# Patient Record
Sex: Male | Born: 1955
Health system: Southern US, Community
[De-identification: ages and names within clinical notes are randomized; demographics above are authoritative.]

## PROBLEM LIST (undated history)

## (undated) DIAGNOSIS — C61 Malignant neoplasm of prostate: Secondary | ICD-10-CM

## (undated) DIAGNOSIS — I10 Essential (primary) hypertension: Secondary | ICD-10-CM

## (undated) DIAGNOSIS — E079 Disorder of thyroid, unspecified: Secondary | ICD-10-CM

## (undated) DIAGNOSIS — J310 Chronic rhinitis: Secondary | ICD-10-CM

## (undated) DIAGNOSIS — E785 Hyperlipidemia, unspecified: Secondary | ICD-10-CM

## (undated) DIAGNOSIS — T7840XA Allergy, unspecified, initial encounter: Secondary | ICD-10-CM

## (undated) DIAGNOSIS — K219 Gastro-esophageal reflux disease without esophagitis: Secondary | ICD-10-CM

## (undated) DIAGNOSIS — J302 Other seasonal allergic rhinitis: Secondary | ICD-10-CM

## (undated) HISTORY — DX: Chronic rhinitis: J31.0

## (undated) HISTORY — DX: Other seasonal allergic rhinitis: J30.2

## (undated) HISTORY — DX: Essential (primary) hypertension: I10

## (undated) HISTORY — DX: Disorder of thyroid, unspecified: E07.9

## (undated) HISTORY — DX: Gastro-esophageal reflux disease without esophagitis: K21.9

## (undated) HISTORY — DX: Hyperlipidemia, unspecified: E78.5

## (undated) HISTORY — DX: Allergy, unspecified, initial encounter: T78.40XA

## (undated) HISTORY — PX: TONSILLECTOMY: SUR1361

## (undated) HISTORY — DX: Malignant neoplasm of prostate: C61

---

## 2005-02-20 ENCOUNTER — Ambulatory Visit: Payer: Self-pay | Admitting: Internal Medicine

## 2005-05-20 ENCOUNTER — Encounter: Payer: Self-pay | Admitting: Internal Medicine

## 2005-10-11 ENCOUNTER — Ambulatory Visit: Payer: Self-pay | Admitting: Internal Medicine

## 2005-10-17 ENCOUNTER — Ambulatory Visit: Payer: Self-pay | Admitting: Internal Medicine

## 2005-12-04 ENCOUNTER — Ambulatory Visit: Payer: Self-pay | Admitting: Internal Medicine

## 2006-01-09 ENCOUNTER — Ambulatory Visit: Payer: Self-pay | Admitting: Internal Medicine

## 2006-03-19 ENCOUNTER — Ambulatory Visit: Payer: Self-pay | Admitting: Internal Medicine

## 2006-03-19 LAB — CONVERTED CEMR LAB
ALT: 34 units/L (ref 0–40)
AST: 23 units/L (ref 0–37)
Chol/HDL Ratio, serum: 4.9
Cholesterol: 179 mg/dL (ref 0–200)
HDL: 36.7 mg/dL — ABNORMAL LOW (ref >39.0)
LDL Cholesterol: 125 mg/dL — ABNORMAL HIGH (ref 0–99)
Triglyceride fasting, serum: 86 mg/dL (ref 0–149)
VLDL: 17 mg/dL (ref 0–40)

## 2006-04-17 ENCOUNTER — Ambulatory Visit: Payer: Self-pay | Admitting: Internal Medicine

## 2006-05-20 HISTORY — PX: OTHER SURGICAL HISTORY: SHX169

## 2006-07-14 ENCOUNTER — Ambulatory Visit: Payer: Self-pay | Admitting: Internal Medicine

## 2006-07-14 LAB — CONVERTED CEMR LAB
BUN: 5 mg/dL — ABNORMAL LOW (ref 6–23)
Basophils Absolute: 0 10*3/uL (ref 0.0–0.1)
Basophils Relative: 0.1 % (ref 0.0–1.0)
CO2: 29 meq/L (ref 19–32)
Calcium: 8.8 mg/dL (ref 8.4–10.5)
Chloride: 102 meq/L (ref 96–112)
Creatinine, Ser: 0.9 mg/dL (ref 0.4–1.5)
Eosinophils Absolute: 0 10*3/uL (ref 0.0–0.6)
Eosinophils Relative: 0.1 % (ref 0.0–5.0)
GFR calc Af Amer: 114 mL/min
GFR calc non Af Amer: 94 mL/min
Glucose, Bld: 94 mg/dL (ref 70–99)
HCT: 44.6 % (ref 39.0–52.0)
Hemoglobin, Urine: NEGATIVE
Hemoglobin: 15 g/dL (ref 13.0–17.0)
Ketones, ur: NEGATIVE mg/dL
Lymphocytes Relative: 9.3 % — ABNORMAL LOW (ref 12.0–46.0)
MCHC: 33.7 g/dL (ref 30.0–36.0)
MCV: 86.7 fL (ref 78.0–100.0)
Monocytes Absolute: 1.1 10*3/uL — ABNORMAL HIGH (ref 0.2–0.7)
Monocytes Relative: 8.7 % (ref 3.0–11.0)
Neutro Abs: 10.1 10*3/uL — ABNORMAL HIGH (ref 1.4–7.7)
Neutrophils Relative %: 81.8 % — ABNORMAL HIGH (ref 43.0–77.0)
Nitrite: NEGATIVE
PSA: 9.3 ng/mL — ABNORMAL HIGH (ref 0.10–4.00)
Platelets: 223 10*3/uL (ref 150–400)
Potassium: 3.6 meq/L (ref 3.5–5.1)
RBC: 5.15 M/uL (ref 4.22–5.81)
RDW: 13.1 % (ref 11.5–14.6)
Sodium: 140 meq/L (ref 135–145)
Urobilinogen, UA: 0.2 (ref 0.0–1.0)
WBC: 12.4 10*3/uL — ABNORMAL HIGH (ref 4.5–10.5)

## 2006-07-29 ENCOUNTER — Ambulatory Visit: Payer: Self-pay | Admitting: Cardiovascular Disease

## 2006-08-18 ENCOUNTER — Ambulatory Visit: Payer: Self-pay

## 2006-08-18 ENCOUNTER — Encounter: Payer: Self-pay | Admitting: Internal Medicine

## 2006-08-18 ENCOUNTER — Ambulatory Visit: Payer: Self-pay | Admitting: Cardiovascular Disease

## 2006-09-23 ENCOUNTER — Ambulatory Visit: Admission: RE | Admit: 2006-09-23 | Discharge: 2006-12-22 | Payer: Self-pay | Admitting: Radiation Oncology

## 2006-12-05 ENCOUNTER — Telehealth (INDEPENDENT_AMBULATORY_CARE_PROVIDER_SITE_OTHER): Payer: Self-pay | Admitting: *Deleted

## 2006-12-11 ENCOUNTER — Encounter: Payer: Self-pay | Admitting: Internal Medicine

## 2006-12-15 ENCOUNTER — Encounter: Admission: RE | Admit: 2006-12-15 | Discharge: 2006-12-15 | Payer: Self-pay | Admitting: Urology

## 2006-12-22 ENCOUNTER — Ambulatory Visit (HOSPITAL_BASED_OUTPATIENT_CLINIC_OR_DEPARTMENT_OTHER): Admission: RE | Admit: 2006-12-22 | Discharge: 2006-12-22 | Payer: Self-pay | Admitting: Urology

## 2006-12-22 ENCOUNTER — Encounter: Payer: Self-pay | Admitting: Internal Medicine

## 2006-12-29 ENCOUNTER — Ambulatory Visit: Payer: Self-pay | Admitting: Internal Medicine

## 2006-12-29 LAB — CONVERTED CEMR LAB: HDL goal, serum: 40 mg/dL

## 2007-01-12 ENCOUNTER — Encounter: Payer: Self-pay | Admitting: Internal Medicine

## 2007-01-12 ENCOUNTER — Ambulatory Visit: Admission: RE | Admit: 2007-01-12 | Discharge: 2007-02-03 | Payer: Self-pay | Admitting: Radiation Oncology

## 2007-04-10 ENCOUNTER — Encounter: Payer: Self-pay | Admitting: Internal Medicine

## 2007-04-21 ENCOUNTER — Encounter: Payer: Self-pay | Admitting: Internal Medicine

## 2007-05-06 ENCOUNTER — Ambulatory Visit: Payer: Self-pay | Admitting: Internal Medicine

## 2007-05-17 LAB — CONVERTED CEMR LAB
Cholesterol: 239 mg/dL (ref 0–200)
Direct LDL: 160.8 mg/dL
Total CHOL/HDL Ratio: 5
Triglycerides: 111 mg/dL (ref 0–149)

## 2007-05-18 ENCOUNTER — Encounter (INDEPENDENT_AMBULATORY_CARE_PROVIDER_SITE_OTHER): Payer: Self-pay | Admitting: *Deleted

## 2007-05-29 ENCOUNTER — Ambulatory Visit: Payer: Self-pay | Admitting: Internal Medicine

## 2007-06-22 ENCOUNTER — Telehealth (INDEPENDENT_AMBULATORY_CARE_PROVIDER_SITE_OTHER): Payer: Self-pay | Admitting: *Deleted

## 2007-06-23 ENCOUNTER — Ambulatory Visit: Payer: Self-pay | Admitting: Internal Medicine

## 2007-06-23 DIAGNOSIS — M4130 Thoracogenic scoliosis, site unspecified: Secondary | ICD-10-CM | POA: Insufficient documentation

## 2007-06-24 ENCOUNTER — Encounter: Payer: Self-pay | Admitting: Internal Medicine

## 2007-06-26 ENCOUNTER — Ambulatory Visit: Payer: Self-pay | Admitting: Internal Medicine

## 2007-06-29 ENCOUNTER — Encounter (INDEPENDENT_AMBULATORY_CARE_PROVIDER_SITE_OTHER): Payer: Self-pay | Admitting: *Deleted

## 2007-06-30 ENCOUNTER — Telehealth (INDEPENDENT_AMBULATORY_CARE_PROVIDER_SITE_OTHER): Payer: Self-pay | Admitting: *Deleted

## 2007-09-04 ENCOUNTER — Ambulatory Visit: Payer: Self-pay | Admitting: Internal Medicine

## 2007-09-04 DIAGNOSIS — E785 Hyperlipidemia, unspecified: Secondary | ICD-10-CM | POA: Insufficient documentation

## 2007-09-04 DIAGNOSIS — C61 Malignant neoplasm of prostate: Secondary | ICD-10-CM | POA: Insufficient documentation

## 2007-09-05 LAB — CONVERTED CEMR LAB
ALT: 27 units/L (ref 0–53)
Albumin: 4.2 g/dL (ref 3.5–5.2)
Alkaline Phosphatase: 55 units/L (ref 39–117)
Cholesterol: 161 mg/dL (ref 0–200)
LDL Cholesterol: 99 mg/dL (ref 0–99)
Total Protein: 7.1 g/dL (ref 6.0–8.3)
Triglycerides: 68 mg/dL (ref 0–149)

## 2007-09-07 ENCOUNTER — Encounter (INDEPENDENT_AMBULATORY_CARE_PROVIDER_SITE_OTHER): Payer: Self-pay | Admitting: *Deleted

## 2008-11-17 ENCOUNTER — Telehealth (INDEPENDENT_AMBULATORY_CARE_PROVIDER_SITE_OTHER): Payer: Self-pay | Admitting: *Deleted

## 2008-12-01 ENCOUNTER — Ambulatory Visit: Payer: Self-pay | Admitting: Internal Medicine

## 2008-12-01 LAB — CONVERTED CEMR LAB
AST: 31 units/L (ref 0–37)
Albumin: 4.1 g/dL (ref 3.5–5.2)
Alkaline Phosphatase: 54 units/L (ref 39–117)
Bilirubin, Direct: 0.2 mg/dL (ref 0.0–0.3)
Total Bilirubin: 1.4 mg/dL — ABNORMAL HIGH (ref 0.3–1.2)
Total CHOL/HDL Ratio: 3
Triglycerides: 57 mg/dL (ref 0.0–149.0)

## 2008-12-05 ENCOUNTER — Ambulatory Visit: Payer: Self-pay | Admitting: Internal Medicine

## 2008-12-13 ENCOUNTER — Telehealth (INDEPENDENT_AMBULATORY_CARE_PROVIDER_SITE_OTHER): Payer: Self-pay | Admitting: *Deleted

## 2008-12-27 ENCOUNTER — Telehealth: Payer: Self-pay | Admitting: Family Medicine

## 2008-12-28 ENCOUNTER — Ambulatory Visit: Payer: Self-pay | Admitting: Internal Medicine

## 2009-01-06 ENCOUNTER — Ambulatory Visit: Payer: Self-pay | Admitting: Internal Medicine

## 2009-03-30 ENCOUNTER — Ambulatory Visit: Payer: Self-pay | Admitting: Internal Medicine

## 2009-05-12 ENCOUNTER — Ambulatory Visit: Payer: Self-pay | Admitting: Family Medicine

## 2009-09-11 ENCOUNTER — Telehealth (INDEPENDENT_AMBULATORY_CARE_PROVIDER_SITE_OTHER): Payer: Self-pay | Admitting: *Deleted

## 2010-04-11 ENCOUNTER — Telehealth (INDEPENDENT_AMBULATORY_CARE_PROVIDER_SITE_OTHER): Payer: Self-pay | Admitting: *Deleted

## 2010-05-20 HISTORY — PX: COLONOSCOPY: SHX174

## 2010-05-23 ENCOUNTER — Ambulatory Visit
Admission: RE | Admit: 2010-05-23 | Discharge: 2010-05-23 | Payer: Self-pay | Source: Home / Self Care | Attending: Internal Medicine | Admitting: Internal Medicine

## 2010-05-23 DIAGNOSIS — M5412 Radiculopathy, cervical region: Secondary | ICD-10-CM | POA: Insufficient documentation

## 2010-05-23 DIAGNOSIS — K219 Gastro-esophageal reflux disease without esophagitis: Secondary | ICD-10-CM | POA: Insufficient documentation

## 2010-05-30 ENCOUNTER — Ambulatory Visit
Admission: RE | Admit: 2010-05-30 | Discharge: 2010-05-30 | Payer: Self-pay | Source: Home / Self Care | Attending: Internal Medicine | Admitting: Internal Medicine

## 2010-05-30 ENCOUNTER — Other Ambulatory Visit: Payer: Self-pay | Admitting: Internal Medicine

## 2010-05-30 LAB — HEPATIC FUNCTION PANEL
ALT: 30 U/L (ref 0–53)
AST: 34 U/L (ref 0–37)
Albumin: 4.1 g/dL (ref 3.5–5.2)
Alkaline Phosphatase: 55 U/L (ref 39–117)
Bilirubin, Direct: 0.1 mg/dL (ref 0.0–0.3)
Total Bilirubin: 1.1 mg/dL (ref 0.3–1.2)
Total Protein: 6.9 g/dL (ref 6.0–8.3)

## 2010-05-30 LAB — CBC WITH DIFFERENTIAL/PLATELET
Basophils Absolute: 0 10*3/uL (ref 0.0–0.1)
Basophils Relative: 0.1 % (ref 0.0–3.0)
Eosinophils Absolute: 0.2 10*3/uL (ref 0.0–0.7)
Eosinophils Relative: 3.7 % (ref 0.0–5.0)
HCT: 42.1 % (ref 39.0–52.0)
Hemoglobin: 14.4 g/dL (ref 13.0–17.0)
Lymphocytes Relative: 23.5 % (ref 12.0–46.0)
Lymphs Abs: 1.2 10*3/uL (ref 0.7–4.0)
MCHC: 34.2 g/dL (ref 30.0–36.0)
MCV: 88.5 fl (ref 78.0–100.0)
Monocytes Absolute: 0.6 10*3/uL (ref 0.1–1.0)
Monocytes Relative: 10.7 % (ref 3.0–12.0)
Neutro Abs: 3.3 10*3/uL (ref 1.4–7.7)
Neutrophils Relative %: 62 % (ref 43.0–77.0)
Platelets: 183 10*3/uL (ref 150.0–400.0)
RBC: 4.76 Mil/uL (ref 4.22–5.81)
RDW: 14 % (ref 11.5–14.6)
WBC: 5.3 10*3/uL (ref 4.5–10.5)

## 2010-05-30 LAB — LIPID PANEL
Cholesterol: 143 mg/dL (ref 0–200)
HDL: 48.2 mg/dL (ref >39.00)
LDL Cholesterol: 82 mg/dL (ref 0–99)
Total CHOL/HDL Ratio: 3
Triglycerides: 62 mg/dL (ref 0.0–149.0)
VLDL: 12.4 mg/dL (ref 0.0–40.0)

## 2010-06-21 NOTE — Progress Notes (Signed)
Summary: Refill Requests  Phone Note Refill Request Call back at [phone] Message from:  Pharmacy on April 11, 2010 10:33 AM  Refills Requested: Medication #1:  CRESTOR 20 MG  TABS 1 qd   Dosage confirmed as above?Dosage Confirmed   Supply Requested: 3 months   Last Refilled: 12/05/2008  Medication #2:  OMEPRAZOLE 20 MG  CPDR 1 by mouth QD   Dosage confirmed as above?Dosage Confirmed   Supply Requested: 3 months   Last Refilled: 09/11/2009 [facility]  Next Appointment Scheduled: none Initial call taken by: Harold Barban,  April 11, 2010 10:34 AM  Follow-up for Phone Call        Patient needs to schedule  a CPX  Follow-up by: Shonna Chock CMA,  April 11, 2010 10:41 AM    New/Updated Medications: OMEPRAZOLE 20 MG  CPDR (OMEPRAZOLE) 1 by mouth once daily**APPOINTMENT DUE** CRESTOR 20 MG  TABS (ROSUVASTATIN CALCIUM) 1 by mouth once daily **APPOINTMENT DUE** Prescriptions: OMEPRAZOLE 20 MG  CPDR (OMEPRAZOLE) 1 by mouth once daily**APPOINTMENT DUE**  #30 x 0   Entered by:   Shonna Chock CMA   Authorized by:   Marga Melnick MD   Signed by:   Shonna Chock CMA on 04/11/2010   Method used:   Electronically to        [facility]* (retail)       [address]       [address] [place] [address]       [place], [place]  [address]       Ph: [phone]       Fax: [phone]   RxID:   [id number] CRESTOR 20 MG  TABS (ROSUVASTATIN CALCIUM) 1 by mouth once daily **APPOINTMENT DUE**  #30 x 0   Entered by:   Shonna Chock CMA   Authorized by:   Marga Melnick MD   Signed by:   Shonna Chock CMA on 04/11/2010   Method used:   Electronically to        [facility]* (retail)       [address]       [address] [place] [address]       [place], [place]  [address]       Ph: [phone]       Fax: [phone]   RxID:   [id number]

## 2010-06-21 NOTE — Progress Notes (Signed)
Summary: Refill Request  Phone Note Refill Request Call back at [phone] Message from:  Pharmacy on September 11, 2009 1:03 PM  Refills Requested: Medication #1:  OMEPRAZOLE 20 MG  CPDR 1 by mouth QD   Dosage confirmed as above?Dosage Confirmed   Supply Requested: 3 months   Last Refilled: 06/14/2009 Next Appointment Scheduled: none Initial call taken by: Harold Barban,  September 11, 2009 1:04 PM    Prescriptions: OMEPRAZOLE 20 MG  CPDR (OMEPRAZOLE) 1 by mouth QD  #90 Capsule x 1   Entered by:   Shonna Chock   Authorized by:   Marga Melnick MD   Signed by:   Shonna Chock on 09/11/2009   Method used:   Electronically to        [facility]* (retail)       [address]       [address] [place] [address]       [place], [place]  [address]       Ph: [phone]       Fax: [phone]   RxID:   [id number]

## 2010-06-21 NOTE — Assessment & Plan Note (Signed)
Summary: MED REFILL/KB   Vital Signs:  Patient profile:   55 year old male Height:      66.5 inches Weight:      195.6 pounds BMI:     31.21 Temp:     97.6 degrees F oral Pulse rate:   76 / minute Resp:     16 per minute BP sitting:   134 / 80  (left arm) Cuff size:   large  Vitals Entered By: Shonna Chock CMA (May 23, 2010 2:12 PM) CC: Renew meds (90 day supply-[facility] [facility] out-patient) , Heartburn, Lipid Management, Back pain   CC:  Renew meds (90 day supply-[facility]) , Heartburn, Lipid Management, and Back pain.  History of Present Illness:      This is a 55 year old man who presents for F/U of GERD/ Heartburn.  The patient denies acid reflux, sour taste in mouth, epigastric pain, chest pain, trouble swallowing, weight loss, and weight gain if he continues the medication.  The patient denies the following alarm features: melena, dysphagia, hematemesis, and vomiting.  Symptoms are worse with spicy foods. No prior evaluation  to date.  The patient has found the PPI, Omeprazole to completely controls the symptoms .      Hyperlipidemia Follow-Up:  The patient denies muscle aches, flushing, itching, constipation, diarrhea, and fatigue.  The patient denies the following symptoms: chest pain/pressure, exercise intolerance, dypsnea, palpitations, syncope, and pedal edema.  Compliance with medications (by patient report) has been near 100%.  Dietary compliance has been good.  The patient reports exercising 5-7 X per week.  Adjunctive measures currently used by the patient include fiber, ASA, and fish oil supplements.  last LDL was 65 in 2010.      The patient also  has intermittent back pain.  The pain is located in the left upper  thoracic back @ the scapula.  The pain began after straining on  working out on weight machines. Swimming breast stroke will exacerbate it  The pain radiates to the left  4th & 5th fingers.   Lipid Management History:      Positive NCEP/ATP III risk  factors include male age 57 years old or older.  Negative NCEP/ATP III risk factors include non-diabetic, no family history for ischemic heart disease, non-tobacco-user status, non-hypertensive, no ASHD (atherosclerotic heart disease), no prior stroke/TIA, no peripheral vascular disease, and no history of aortic aneurysm.     Current Medications (verified): 1)  Omeprazole 20 Mg  Cpdr (Omeprazole) .Marland Kitchen.. 1 By Mouth Once Daily**appointment Due** 2)  Mucinex D .... Prn 3)  Crestor 20 Mg  Tabs (Rosuvastatin Calcium) .Marland Kitchen.. 1 By Mouth Once Daily **appointment Due** 4)  Viagra 100 Mg  Tabs (Sildenafil Citrate) .... Prn 5)  Adult Aspirin Ec Low Strength 81 Mg  Tbec (Aspirin) 6)  Voltaren 1 % Gel (Diclofenac Sodium) .... Apply Two Times A Day As Needed 7)  Tramadol Hcl 50 Mg Tabs (Tramadol Hcl) .Marland Kitchen.. 1 Q 6 Hrs As Needed  Allergies (verified): No Known Drug Allergies  Past History:  Past Medical History: Hyperlipidemia: [place]ham Study LDL goal = < 160. NMR Lipoprofile 2007: LDL 188 ( 2618/2018), HDL 38, TG 127. LDL goal= < 100, ideally < 75. rhinitis , seasonal prostate cancer, Dr Brunilda Payor  Past Surgical History: Prostatectomy,  S/P radiation, Dr  Su Grand Tonsillectomy Colonoscopy : NONE to date [facility] reviewed)  Family History: Father: suicide Mother:  lung cancer Siblings: bro: dyslipidemia, 5 vessel CBAG 2010 @ 56;prostate  cancer; M uncle : MI @ 29; MGF : MI @ 29  Social History: Former Smoker: quit 1986 Alcohol use-yes: socialy Regular exercise-yes  Review of Systems GU:  Denies discharge, dysuria, and hematuria; Last PSA 0.15.  Physical Exam  General:  well-nourished;alert,appropriate and cooperative throughout examination Neck:  No deformities, masses, or tenderness noted. Lungs:  Normal respiratory effort, chest expands symmetrically. Lungs are clear to auscultation, no crackles or wheezes. Heart:  Normal rate and regular rhythm. S1 and S2 normal without gallop, murmur, click, rub  .S4 Abdomen:  Bowel sounds positive,abdomen soft and non-tender without masses, organomegaly or hernias noted. Prostate:  Dr  Brunilda Payor  Pulses:  R and L carotid,radial,dorsalis pedis and posterior tibial pulses are full and equal bilaterally Extremities:  No clubbing, cyanosis, edema, or deformity noted with normal full range of motion of  arms  Neurologic:  alert & oriented X3, strength normal in all extremities, and DTRs symmetrical and normal.   Skin:  Intact without suspicious lesions or rashes Cervical Nodes:  No lymphadenopathy noted Axillary Nodes:  No palpable lymphadenopathy Psych:  memory intact for recent and remote, normally interactive, and good eye contact.     Impression & Recommendations:  Problem # 1:  GERD (ICD-530.81)  His updated medication list for this problem includes:    Omeprazole 20 Mg Cpdr (Omeprazole) .Marland Kitchen... 1 by mouth once daily  Problem # 2:  HYPERLIPIDEMIA (ICD-272.4)  His updated medication list for this problem includes:    Crestor 20 Mg Tabs (Rosuvastatin calcium) .Marland Kitchen... 1 by mouth once daily   Problem # 3:  CERVICAL RADICULOPATHY, LEFT (ICD-723.4)  C-8   Orders: T-Cervical Spine Comp w/Flex & Ext (16109UE)  Complete Medication List: 1)  Omeprazole 20 Mg Cpdr (Omeprazole) .Marland Kitchen.. 1 by mouth once daily 2)  Mucinex D  .... Prn 3)  Crestor 20 Mg Tabs (Rosuvastatin calcium) .Marland Kitchen.. 1 by mouth once daily 4)  Viagra 100 Mg Tabs (Sildenafil citrate) .... Prn 5)  Adult Aspirin Ec Low Strength 81 Mg Tbec (Aspirin) 6)  Voltaren 1 % Gel (Diclofenac sodium) .... Apply two times a day as needed 7)  Tramadol Hcl 50 Mg Tabs (Tramadol hcl) .Marland Kitchen.. 1 q 6 hrs as needed  Lipid Assessment/Plan:      Based on NCEP/ATP III, the patient's risk factor category is "0-1 risk factors".  The patient's lipid goals are as follows: Total cholesterol goal is 200; LDL cholesterol goal is 100; HDL cholesterol goal is 40; Triglyceride goal is 150.  His LDL cholesterol goal has been met.   Secondary causes for hyperlipidemia have been ruled out.  He has been counseled on adjunctive measures for lowering his cholesterol and has been provided with dietary instructions.    Patient Instructions: 1)  Please schedule fasting labs: 2)  Hepatic Panel (995.20); 3)  Lipid Panel (272.4); 4)  CBC w/ Diff (530.81). 5)  Schedule a colonoscopy  to help detect colon cancer as  per the [facility] we discussed. Complete stool cards. 6)  Avoid foods high in acid (tomatoes, citrus juices, spicy foods). Avoid eating within two hours of lying down or before exercising. Do not over eat; try smaller more frequent meals. Elevate head of bed twelve inches when sleeping. Prescriptions: OMEPRAZOLE 20 MG  CPDR (OMEPRAZOLE) 1 by mouth once daily  #90 x 0   Entered and Authorized by:   Marga Melnick MD   Signed by:   Marga Melnick MD on 05/23/2010   Method used:   Electronically to  [facility]* (retail)       [address]       [address]       [place], [place]  [address]       Ph: [phone]       Fax: [phone]   RxID:   [id number] CRESTOR 20 MG  TABS (ROSUVASTATIN CALCIUM) 1 by mouth once daily  #90 x 0   Entered and Authorized by:   Marga Melnick MD   Signed by:   Marga Melnick MD on 05/23/2010   Method used:   Electronically to        [facility]* (retail)       [address]       [address]mail[address]       [place], [place]  [address]       Ph: [phone]       Fax: [phone]   RxID:   [id number]    Orders Added: 1)  Est. Patient Level IV [88416] 2)  T-Cervical Spine Comp w/Flex & Ext [72052TC]

## 2010-08-22 ENCOUNTER — Other Ambulatory Visit: Payer: Self-pay | Admitting: Internal Medicine

## 2010-09-13 ENCOUNTER — Encounter: Payer: Self-pay | Admitting: Internal Medicine

## 2010-09-14 ENCOUNTER — Encounter: Payer: Self-pay | Admitting: Internal Medicine

## 2010-09-14 ENCOUNTER — Ambulatory Visit (INDEPENDENT_AMBULATORY_CARE_PROVIDER_SITE_OTHER): Payer: 59 | Admitting: Internal Medicine

## 2010-09-14 VITALS — BP 128/84 | HR 99 | Temp 99.4°F | Wt 184.0 lb

## 2010-09-14 DIAGNOSIS — J029 Acute pharyngitis, unspecified: Secondary | ICD-10-CM

## 2010-09-14 DIAGNOSIS — J069 Acute upper respiratory infection, unspecified: Secondary | ICD-10-CM

## 2010-09-14 LAB — POCT RAPID STREP A (OFFICE): Rapid Strep A Screen: NEGATIVE

## 2010-09-14 NOTE — Assessment & Plan Note (Addendum)
Most likely has a URI, strep a neg. See instructions. Also rec to go back to loratadine. Addendum: request abx, I see no indication for that at this point, will call in few days if no better

## 2010-09-14 NOTE — Patient Instructions (Signed)
Rest, fluids , tylenol For cough, take Mucinex DM twice a day as needed  call if no better in few days Call anytime if the symptoms are severe, you have high fever, short of breath

## 2010-09-14 NOTE — Progress Notes (Signed)
  Subjective:    Patient ID: GWEN EDLER, male    DOB: 1956/03/07, 55 y.o.   MRN: [id number]  HPI  Mild ST , "aches" x 3 -4 days, has forgotten to take loratadine in the last few days . ST today is actually slt better today  Past Medical History  Diagnosis Date  . Hyperlipemia     [place] study LDL goal= <160. NMR Lipoprofile 2007: LDL 188 (2618/2018) HDL 38, TG 127. LDL goal= <100, ideally <75  . Rhinitis   . Prostate cancer     Dr Brunilda Payor   No past surgical history on file.   Review of Systems  Constitutional:       + subjective fever  HENT:       + nasal d/c, mild PN drip, > than baseline  Respiratory:       Mild cough, dry       Objective:   Physical Exam  Constitutional: He appears well-developed and well-nourished. No distress.  HENT:  Head: Normocephalic and atraumatic.       Nose slt congested w/o d/c . Throat no red-no d/c Tonsils not seen  Cardiovascular: Normal rate, regular rhythm and normal heart sounds.   No murmur heard. Pulmonary/Chest: Effort normal and breath sounds normal. No respiratory distress. He has no wheezes. He has no rales.          Assessment & Plan:

## 2010-09-19 ENCOUNTER — Telehealth: Payer: Self-pay | Admitting: Internal Medicine

## 2010-09-19 NOTE — Telephone Encounter (Signed)
Error wrong patient

## 2010-10-01 ENCOUNTER — Encounter: Payer: Self-pay | Admitting: Internal Medicine

## 2010-10-02 ENCOUNTER — Ambulatory Visit (INDEPENDENT_AMBULATORY_CARE_PROVIDER_SITE_OTHER): Payer: 59 | Admitting: Internal Medicine

## 2010-10-02 ENCOUNTER — Encounter: Payer: Self-pay | Admitting: Internal Medicine

## 2010-10-02 DIAGNOSIS — J069 Acute upper respiratory infection, unspecified: Secondary | ICD-10-CM

## 2010-10-02 DIAGNOSIS — K625 Hemorrhage of anus and rectum: Secondary | ICD-10-CM

## 2010-10-02 DIAGNOSIS — K649 Unspecified hemorrhoids: Secondary | ICD-10-CM

## 2010-10-02 MED ORDER — HYDROCORTISONE ACE-PRAMOXINE 1-1 % RE FOAM: 1.0000 | Freq: Two times a day (BID) | RECTAL | Status: AC

## 2010-10-02 NOTE — Progress Notes (Signed)
Subjective:    Patient ID: Jose Stevens, male    DOB: 16-Jan-1956, 55 y.o.   MRN: [id number]  HPI Respiratory tract infection Onset/symptoms: fever 3 weeks ago with arthralgias, ST & nasal congestion Exposures (illness/environmental/extrinsic):symptoms 24 hrs after overexertion Progression of symptoms:improved as of  Treatments/response:Ceftin 2 weeks ago from  [facility] with only partial benefit; he took it irregularly Present symptoms: malaise last night & this am, perspiring this am; all symptoms resolved after BM   Fever/chills/sweats:no Frontal headache:no Facial pain:no Nasal purulence:no Dental pain:no Lymphadenopathy:no Wheezing/shortness of breath:no Cough/sputum/hemoptysis:no Associated extrinsic/allergic symptoms:itchy eyes/ sneezing:sneezing 1X/ day Past medical history: Seasonal allergies/asthma:no Smoking history: quit 1986   Rectal bleeding:                                                                                                                                  Onset: 1 week ago & yesterday attributed to hemorrhoids  Severity: painless;only on tissue Better with: Bee pollen keeps him regular Worse with: with straining Symptoms Nausea/Vomiting: no  Diarrhea: no  Constipation: no  Melena: no  Hematemesis: no  Anorexia: no  Fever/Chills: no Wt loss:naturally with Bee pollen & exercise NSAIDs/ASA: nis No bleeding dyscrasias or clotting issues (hematuria, epistaxis)   Past Surgeries: no Colonoscopy ; never scheduled (SOC reviewed). No FH of GI disease           Review of Systems     Objective:   Physical Exam  General appearance is one of good health and nourishment w/o distress.  Eyes: No conjunctival inflammation or scleral icterus is present. Arcus senilis.Ears:  External ear exam shows no significant lesions or deformities.  Otoscopic examination reveals clear canals, tympanic membranes are intact bilaterally without bulging, retraction,  inflammation or discharge. Hearing is grossly normal bilaterally. Oral exam: Dental hygiene is good; lips and gums are healthy appearing.There is no oropharyngeal erythema or exudate noted. l    Heart:  Normal rate and regular rhythm. S1 and S2 normal without gallop, murmur, click, rub or other extra sounds     Lungs:Chest clear to auscultation; no wheezes, rhonchi,rales ,or rubs present.No increased work of breathing.   Abdomen: bowel sounds normal, soft and non-tender without masses, organomegaly or hernias noted.  No guarding or rebound   Skin:Warm & dry.  Intact without suspicious lesions or rashes ; no jaundice or tenting  Lymphatic: No lymphadenopathy is noted about the head, neck, axilla, or inguinal areas.    Rectal tone is normal. Internal and external hemorrhoids are present; fecal occult blood is trace positive. The prostate is enlarged and slightly irregular in contour.             Assessment & Plan:  #1 respiratory tract infection; clinically resolved. No indication for additional antibiotics are present  #2 rectal bleeding most likely from hemorrhoids.  #3 prostate cancer, as per urology  Plan: #1 sitz baths followed by ProctoFoam-HC to 3 times a day  as needed  #2 GI referral for consideration of colonoscopy as per [facility]

## 2010-10-02 NOTE — Op Note (Signed)
Jose Stevens, Jose Stevens              ACCOUNT NO.:  [id number]   MEDICAL RECORD NO.:  [id number]         PATIENT TYPE:  HAMB   LOCATION:                               FACILITY:  [facility]   PHYSICIAN:  Lindaann Slough, M.D.  DATE OF BIRTH:  1955/11/12   DATE OF PROCEDURE:  12/22/2006  DATE OF DISCHARGE:                               OPERATIVE REPORT   PREOPERATIVE DIAGNOSIS:  Adenocarcinoma of prostate.   POSTOPERATIVE DIAGNOSIS:  Adenocarcinoma of prostate.   PROCEDURES:  1. I-125 seeds implantation.  2. Cystoscopy.   SURGEON:  Dr. Brunilda Payor and Dr. Dayton Scrape.   ANESTHESIA:  General.   INDICATIONS:  The patient is a 55 year old male with prostate cancer  diagnosed by biopsy on August 28, 2006.  PSA at diagnosis was 7.66.  Gleason score is 3+4.  The  treatment options were discussed with the  patient and he chose to have radiation treatment.  He was seen in  consultation by Dr. Dayton Scrape.  He is scheduled for combination therapy and  he has already received IMRT and he is scheduled today for  brachytherapy.   PROCEDURE IN DETAIL:  Under general anesthesia, the patient was prepped  and draped and placed in the dorsal lithotomy position.  The transducer  was inserted in the rectum and the grid was attached to the transducer.  Two stabilizing needles were placed in the prostate and ultrasound  planning was done by Dr. Dayton Scrape.  When the planning was completed, under  ultrasound guidance, a total of 22 needles where used to insert 51 seeds  in the prostate.  He also had seeds in the seminal vesicles.  At the end  of the procedure, an x-ray of the pelvis was done and there appears to  be good seeds distribution.  Then, the Foley catheter that was  previously inserted in the bladder was removed.   A flexible cystoscope was then inserted in the bladder.  The anterior  urethra was normal.  There was moderate prostatic hypertrophy.  There  was no evidence of seeds, stone or tumor in the bladder.  The  ureteral  orifices are in  normal position and shape with clear efflux.  The cystoscope was then  removed.  A #16 Foley catheter was then reinserted in the bladder.   The patient tolerated the procedure well and left the OR in satisfactory  condition to postanesthesia care unit.      Lindaann Slough, M.D.  Electronically Signed     MN/MEDQ  D:  12/22/2006  T:  12/22/2006  Job:  [id number]   cc:   Maryln Gottron, M.D.  Fax: [phone]

## 2010-10-02 NOTE — Patient Instructions (Signed)
Use the ProctoFoam HC 2-3 times a day after the sitz bath's.

## 2010-10-05 ENCOUNTER — Encounter: Payer: Self-pay | Admitting: *Deleted

## 2010-10-05 NOTE — Assessment & Plan Note (Signed)
[facility]                            CARDIOLOGY OFFICE NOTE   DASH, CARDARELLI                     MRN:          [id number]  DATE:07/29/2006                            DOB:          [birth year]    Mr. Jose Stevens is seen today as a new patient.  He is a very pleasant 55-  year-old patient of Dr. Frederik Pear.  His wife works at the sleep center of  [employer].   He has been having substernal chest pressure radiating to the left arm.   The symptoms started 4 to 5 weeks ago.  He indicates that the pain  started when he tried a new weight-lifting machine at the fitness club  called [employer].  He had upper back pain behind his left shoulder blade.  It clearly was related to this incident.  Since that time, it has waxed  and waned.  He thought that it had improved and then he re-injured it  doing the butterfly stroke in the pool.  He has been taking  antiinflammatories for the last 2 days with some relief.  In general,  prior to this, he has been fairly healthy.  His coronary risk factors  include hypercholesterolemia, which he has been on drugs for, for about  6 months.   The patient is a non-diabetic nonsmoker.  There is no family history for  premature coronary disease, and his blood pressure has been excellent.   The pain in his chest can be exertional, but it is more positional in  nature, particularly increasing with twisting motions.   REVIEW OF SYSTEMS:  Remarkable for no PND or orthopnea.  No history of  rheumatic heart disease.  No syncope.  No palpitations.   He quit smoking in 1986.  He drinks 2 to 3 caffeinated beverages a day.  Up until his injury, he had been fairly fit and worked out on a regular  basis.  He is self-employed.  He is happily married.  He has 2 teenage  boys who are fairly good athletes.  Only previous surgeries include a  tonsillectomy.  His mother died of cancer at age 8.  His father died at  age 83 of  self-inflicted problems.   CURRENT MEDICATIONS:  1. Prilosec 20 a day.  2. Cipro for prostate problems.  3. Aspirin a day.  4. Vytorin.   I had a fairly long discussion with the patient about his prostate.  When I found out he was taking Cipro, I asked him what for.  He  apparently has had escalating PSA going from 3 to 6 to 9.  I urged him  to follow up with a urologist and have an ultrasound-guided biopsy to  further look into this.  I explained to him the natural history of  prostate cancer and the need for early treatment and detection.   EXAM:  Remarkable for a blood pressure of 125/70, pulse 70 and regular.  HEENT:  Normal.  There are no carotid bruits.  No lymphadenopathy.  There is an S1, S2 with a very short systolic murmur.  ABDOMEN:  Benign.  LOWER EXTREMITIES:  Intact pulses.  No edema.  There are no renal bruits.   ELECTROCARDIOGRAM:  At rest, normal.   IMPRESSION:  Atypical chest pain, likely related to muscle strain,  occurring while working out and re-injured while swimming.   The pain seems to be improving with antiinflammatories.   I think the patient should have an exercise treadmill test.  He has a  normal baseline EKG and I think a treadmill would rule out coronary  disease efficiently.   I think it is also reasonable to do a 2D echocardiogram in the setting  of chest pain to rule out hypertrophic cardiomyopathy or other  structural abnormalities of his heart.  We will try to arrange both of  these tests on the same day.  He will continue with his antiinflammatory  medicines.  He will follow up with Dr. Alwyn Ren in regards to his  cholesterol.   Overall, the patient seems to be more fit than most 55 year old  patients, and I suspect this pain is truly musculoskeletal.     Theron Arista C. Eden Emms, MD, [facility]  Electronically Signed    PCN/MedQ  DD: 07/29/2006  DT: 07/31/2006  Job #: [id number]

## 2010-10-05 NOTE — Procedures (Signed)
[name]                              EXERCISE TREADMILL   Jose Stevens, Jose Stevens                     MRN:          [id number]  DATE:08/18/2006                            DOB:          03-28-1956    A 55 year old patient with chest pain.  The patient exercises for 10  minutes on a standard Bruce protocol.  Exercise was stopped due to  fatigue.  Maximum heart rate was 162.  Peak blood pressure is 175/82.  Resting EKG showed some minor ST-T wave changes in the inferior leads  with stress.  There was no additional changes and no evidence of  significant ischemia or arrhythmia.   IMPRESSION:  Negative exercise stress test of a good workload.     Noralyn Pick. Eden Emms, MD, [facility]  Electronically Signed    PCN/MedQ  DD: 08/18/2006  DT: 08/18/2006  Job #: [id number]

## 2010-10-08 ENCOUNTER — Ambulatory Visit (INDEPENDENT_AMBULATORY_CARE_PROVIDER_SITE_OTHER): Payer: 59 | Admitting: Internal Medicine

## 2010-10-08 ENCOUNTER — Encounter: Payer: Self-pay | Admitting: Internal Medicine

## 2010-10-08 VITALS — BP 138/88 | HR 120 | Temp 98.7°F | Wt 182.0 lb

## 2010-10-08 DIAGNOSIS — M542 Cervicalgia: Secondary | ICD-10-CM

## 2010-10-08 DIAGNOSIS — Z8719 Personal history of other diseases of the digestive system: Secondary | ICD-10-CM

## 2010-10-08 DIAGNOSIS — J029 Acute pharyngitis, unspecified: Secondary | ICD-10-CM

## 2010-10-08 NOTE — Progress Notes (Signed)
  Subjective:    Patient ID: Jose Stevens, male    DOB: 23-Jan-1956, 55 y.o.   MRN: [id number]  HPI SORE THROAT   Onset: last week in April(prior record reviewed)  Severity: up to 7 Better with: initially better with Cefuroxime Rxed 04/28 Symptoms  Fever:? yes, but not documented    Cough/URI sxs: no Myalgias: no, but arthralgias Headache: no Swollen neck glands: yes, under jaw    Recent Strep Exposure:son had strep 2 mos ago Allergy Sxs: no Red Flags Breathing difficulty: no Trismus: no      Review of Systems ? Dental abscess as per Dentist 2  Mos ago based on Xrays     Objective:   Physical Exam General appearance is of good health and nourishment; no acute distress or increased work of breathing is present.  No  lymphadenopathy about the head, neck, or axilla noted.   Eyes: No conjunctival inflammation or lid edema is present.Vision intact  Ears:  External ear exam shows no significant lesions or deformities.  Otoscopic examination reveals clear canals, tympanic membranes are intact bilaterally without bulging, retraction, inflammation or discharge.  Nose:  External nasal examination shows no deformity or inflammation. Nasal mucosa are pink and moist without lesions or exudates. No septal dislocation or dislocation.No obstruction to airflow.   Oral exam: Dental hygiene is good; lips and gums are healthy appearing.There is no oropharyngeal erythema or exudate noted.  Lower partial  Neck:  No deformities or  Masses. Thyroid w/o nodules but  tenderness noted.   Supple with full range of motion without pain.   Heart:  Normal rate and regular rhythm. S1 and S2 normal without gallop, murmur, click, rub or other extra sounds. S4  Lungs:Chest clear to auscultation; no wheezes, rhonchi,rales ,or rubs present.No increased work of breathing.    Extremities:  No cyanosis, edema, or clubbing  noted    Skin: Warm & dry w/o jaundice or tenting.          Assessment & Plan:  #1  sore throat; clinically the thyroid is tender to palpation without any palpable abnormalities.S/P Cefuroxime x 10 days  #2 possible dental abscess  Plan: #1 CBC and differential, free T4, free T3, TSH, ultrasound of the thyroid.

## 2010-10-08 NOTE — Patient Instructions (Addendum)
Use the Magic mouthwash as prescribed, 1 teaspoon gargle and swallow 3 times a day. Please get a thermometer and keep a temperature diary.  Please share this record with your Dentist. If there is a dental abscess it does need to be incised & drained

## 2010-10-09 ENCOUNTER — Encounter: Payer: Self-pay | Admitting: *Deleted

## 2010-10-09 LAB — CBC WITH DIFFERENTIAL/PLATELET
Basophils Relative: 0.1 % (ref 0.0–3.0)
Eosinophils Absolute: 0.2 10*3/uL (ref 0.0–0.7)
Eosinophils Relative: 2.9 % (ref 0.0–5.0)
Lymphocytes Relative: 21.6 % (ref 12.0–46.0)
Monocytes Relative: 17 % — ABNORMAL HIGH (ref 3.0–12.0)
Neutrophils Relative %: 58.4 % (ref 43.0–77.0)
RBC: 4.61 Mil/uL (ref 4.22–5.81)
WBC: 5.3 10*3/uL (ref 4.5–10.5)

## 2010-10-09 LAB — T3, FREE: T3, Free: 3.9 pg/mL (ref 2.3–4.2)

## 2010-10-09 LAB — T4, FREE: Free T4: 1.68 ng/dL — ABNORMAL HIGH (ref 0.60–1.60)

## 2010-10-10 ENCOUNTER — Ambulatory Visit (INDEPENDENT_AMBULATORY_CARE_PROVIDER_SITE_OTHER): Payer: 59 | Admitting: Internal Medicine

## 2010-10-10 ENCOUNTER — Encounter: Payer: Self-pay | Admitting: Internal Medicine

## 2010-10-10 VITALS — BP 110/76 | HR 80 | Ht 67.0 in | Wt 181.0 lb

## 2010-10-10 DIAGNOSIS — Z1211 Encounter for screening for malignant neoplasm of colon: Secondary | ICD-10-CM

## 2010-10-10 DIAGNOSIS — K625 Hemorrhage of anus and rectum: Secondary | ICD-10-CM

## 2010-10-10 MED ORDER — PEG-KCL-NACL-NASULF-NA ASC-C 100 G PO SOLR: 1.0000 | Freq: Once | ORAL | Status: AC

## 2010-10-10 NOTE — Progress Notes (Signed)
HISTORY OF PRESENT ILLNESS:  Jose Stevens is a 55 y.o. male with hyperlipidemia who is sent today regarding minor rectal bleeding and screening colonoscopy. Patient reports intermittent rectal bleeding as manifested by blood on the toilet tissue and occasionally in the stool. There is often a stinging sensation in the rectum with defecation, when blood is present. He denies abdominal pain or change in bowel habits. GI review of systems is otherwise remarkable for GERD. He takes omeprazole with good results. Review of outside laboratories from January 2012 find a normal CBC with a hemoglobin of 14.4. His brother has a history of colon polyps (type unknown), but no family history of colon cancer  REVIEW OF SYSTEMS:  All non-GI ROS negative except for sore throat.  Past Medical History  Diagnosis Date  . Hyperlipemia     [place] study LDL goal= <160. NMR Lipoprofile 2007: LDL 188 (2618/2018) HDL 38, TG 127. LDL goal= <100, ideally <75  . Rhinitis   . Prostate cancer     Dr Jose Stevens  . Hemorrhoids     Past Surgical History  Procedure Date  . Prostatectomy     s/p radiation, Dr.Nesi  . Tonsillectomy     Social History Jose Stevens  reports that he quit smoking about 26 years ago. He has never used smokeless tobacco. He reports that he drinks alcohol. He reports that he does not use illicit drugs.  family history includes Heart attack (age of onset:56) in his maternal grandfather; Heart attack (age of onset:63) in his maternal uncle; Lung cancer in his mother; Prostate cancer in an unspecified family member; and Sudden death in his father.  There is no history of Colon cancer.  No Known Allergies     PHYSICAL EXAMINATION: Vital signs: BP 110/76  Pulse 80  Ht 5\' 7"  (1.702 m)  Wt 181 lb (82.101 kg)  BMI 28.35 kg/m2  Constitutional: generally well-appearing, no acute distress Psychiatric: alert and oriented x3, cooperative Eyes: extraocular movements intact, anicteric,  conjunctiva pink Mouth: oral pharynx moist, no lesions Neck: supple no lymphadenopathy Cardiovascular: heart regular rate and rhythm, no murmur Lungs: clear to auscultation bilaterally Abdomen: soft, nontender, nondistended, no obvious ascites, no peritoneal signs, normal bowel sounds, no organomegaly Rectal: Deferred until colonoscopy Extremities: no lower extremity edema bilaterally Skin: no lesions on visible extremities Neuro: No focal deficits. No asterixis.     ASSESSMENT:  #1. Minor intermittent rectal bleeding, likely due to fissure. #2. Colon cancer screening. Appropriate candidate w/o contraindication.    PLAN:  #1. Colonoscopy.The nature of the procedure, as well as the risks, benefits, and alternatives were carefully and thoroughly reviewed with the patient. Ample time for discussion and questions allowed. The patient understood, was satisfied, and agreed to proceed. The prep prescribed was Movi prep. The patient instructed on its use

## 2010-10-10 NOTE — Patient Instructions (Signed)
Colon lec 10/18/10 3:30 pm arrive 2:30 pm on 4th floor Moviprep prescription sent to your pharmacy for you to pick up. Colonoscopy brochure given for you to review.

## 2010-10-11 ENCOUNTER — Ambulatory Visit (HOSPITAL_COMMUNITY)
Admission: RE | Admit: 2010-10-11 | Discharge: 2010-10-11 | Disposition: A | Discharge status: Home / Self Care | Payer: 59 | Source: Ambulatory Visit | Attending: Internal Medicine | Admitting: Internal Medicine

## 2010-10-11 DIAGNOSIS — E049 Nontoxic goiter, unspecified: Secondary | ICD-10-CM | POA: Insufficient documentation

## 2010-10-11 DIAGNOSIS — M542 Cervicalgia: Secondary | ICD-10-CM | POA: Insufficient documentation

## 2010-10-12 ENCOUNTER — Telehealth: Payer: Self-pay | Admitting: Internal Medicine

## 2010-10-16 ENCOUNTER — Ambulatory Visit (INDEPENDENT_AMBULATORY_CARE_PROVIDER_SITE_OTHER): Payer: 59 | Admitting: Internal Medicine

## 2010-10-16 ENCOUNTER — Encounter: Payer: Self-pay | Admitting: Internal Medicine

## 2010-10-16 VITALS — BP 124/78 | HR 88 | Wt 179.4 lb

## 2010-10-16 DIAGNOSIS — E0590 Thyrotoxicosis, unspecified without thyrotoxic crisis or storm: Secondary | ICD-10-CM

## 2010-10-16 NOTE — Progress Notes (Signed)
  Subjective:    Patient ID: Jose Stevens, male    DOB: Mar 23, 1956, 55 y.o.   MRN: [id number]  HPI thyroid function test and ultrasound were reviewed. 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 I explained we do not know whether this is resolving or as early stages. Constitutional: Weight change: down 3-5 #; Fatigue:some; Sleep pattern:affected by sweats; Appetite:"OK"  Visual change(blurred/diplopia/visual loss):no Hoarseness:no; Swallowing issues:no Cardiovascular: Palpitations:occasioanlly; Racing:?; Irregularity:no. He has been using supplements which do have caffeine supplementation. GI: Constipation:no; Diarrhea:no Derm: Change in nails/hair/skin:no Neuro: Numbness/tingling:no; Tremor:no Psych: Anxiety:no; Depression:no; Panic attacks:no Endo: Temperature intolerance: Heat:no; Cold:yes, some      Review of Systems     Objective:   Physical Exam  Gen.: well muscled & well-nourished; in no acute distress Thyroid: Right lobe firm and minimally tender. No nodules palpable (see thyroid ultrasound )                                                                             Eyes: Extraocular motion intact; no lid lag or proptosis Heart: Normal rhythm and rate without significant murmur, gallop, or extra heart sounds Lungs: Chest clear to auscultation without rales,rales, wheezes Neuro:Deep tendon reflexes are equal and within normal limits; no tremor        Skin: Warm and dry without significant lesions or rashes; no onycholysis Psych: Normally communicative and interactive; no abnormal mood or affect clinically.         Assessment & Plan:  #1 hyperthyroidism, elevated free T4  #2 caffeine supplementation  Plan: Wean the caffeine to equivalent of one to 2 cups of coffee per day max  Repeat free T4, free T3, and TSH in 4 weeks. If symptoms progress pursue radioactive thyroid  imaging.

## 2010-10-16 NOTE — Patient Instructions (Signed)
Please recheck the free T4, free T3, and TSH in 4 weeks. Report any progression of symptoms which would warrant considering radioactive iodine thyroid imaging.

## 2010-10-18 ENCOUNTER — Other Ambulatory Visit: Payer: 59 | Admitting: Internal Medicine

## 2010-10-25 ENCOUNTER — Ambulatory Visit (AMBULATORY_SURGERY_CENTER): Payer: 59 | Admitting: Internal Medicine

## 2010-10-25 ENCOUNTER — Encounter: Payer: Self-pay | Admitting: Internal Medicine

## 2010-10-25 VITALS — BP 141/76 | HR 98 | Temp 97.7°F | Resp 17 | Ht 67.0 in | Wt 181.0 lb

## 2010-10-25 DIAGNOSIS — D126 Benign neoplasm of colon, unspecified: Secondary | ICD-10-CM

## 2010-10-25 DIAGNOSIS — K5730 Diverticulosis of large intestine without perforation or abscess without bleeding: Secondary | ICD-10-CM

## 2010-10-25 DIAGNOSIS — K625 Hemorrhage of anus and rectum: Secondary | ICD-10-CM

## 2010-10-25 DIAGNOSIS — Z1211 Encounter for screening for malignant neoplasm of colon: Secondary | ICD-10-CM

## 2010-10-25 DIAGNOSIS — R933 Abnormal findings on diagnostic imaging of other parts of digestive tract: Secondary | ICD-10-CM

## 2010-10-25 MED ORDER — SODIUM CHLORIDE 0.9 % IV SOLN: 500.0000 mL | INTRAVENOUS | Status: DC

## 2010-10-25 NOTE — Patient Instructions (Signed)
DISCHARGE INSTRUCTIONS GIVEN WITH VERBAL UNDERSTANDING. HANDOUT ON DIVERTICLOSIS GIVEN. RESUME PREVIOUS MEDICATIONS.

## 2010-10-25 NOTE — Progress Notes (Signed)
#  22 Angiocath inserted into right a/c upon insertion of needle pt jumped and jerked arm back.  When trying to flush with normal saline site swelled. #22 removed and pressure applied to site. No bruising or additional swelling.  No complaints of pain.

## 2010-10-26 ENCOUNTER — Telehealth: Payer: Self-pay | Admitting: *Deleted

## 2010-10-26 NOTE — Telephone Encounter (Signed)
Follow up Call- Patient questions:  Do you have a fever, pain , or abdominal swelling? no Pain Score  0 *  Have you tolerated food without any problems? yes  Have you been able to return to your normal activities? yes  Do you have any questions about your discharge instructions: Diet   no Medications  no Follow up visit  no  Do you have questions or concerns about your Care? yes Pt concerned about results of procedure. Pt states "can you please tell me about the results, my wife didn't understand." Reviewed results from procedure report with pt. Pt verbalized understanding. Instructed pt to call back if there were any further questions. Pt verbalized understanding. Actions: * If pain score is 4 or above: No action needed, pain <4.

## 2010-11-12 ENCOUNTER — Other Ambulatory Visit: Payer: Self-pay | Admitting: Internal Medicine

## 2010-11-12 DIAGNOSIS — E039 Hypothyroidism, unspecified: Secondary | ICD-10-CM

## 2010-11-13 ENCOUNTER — Other Ambulatory Visit (INDEPENDENT_AMBULATORY_CARE_PROVIDER_SITE_OTHER): Payer: 59

## 2010-11-13 DIAGNOSIS — E039 Hypothyroidism, unspecified: Secondary | ICD-10-CM

## 2010-11-13 LAB — TSH: TSH: 12.74 u[IU]/mL — ABNORMAL HIGH (ref 0.35–5.50)

## 2010-11-13 NOTE — Progress Notes (Signed)
Labs only

## 2010-11-26 ENCOUNTER — Other Ambulatory Visit: Payer: Self-pay | Admitting: Internal Medicine

## 2010-11-26 ENCOUNTER — Encounter: Payer: Self-pay | Admitting: Internal Medicine

## 2010-11-26 ENCOUNTER — Ambulatory Visit (INDEPENDENT_AMBULATORY_CARE_PROVIDER_SITE_OTHER): Payer: 59 | Admitting: Internal Medicine

## 2010-11-26 VITALS — BP 135/90 | HR 76 | Wt 189.0 lb

## 2010-11-26 DIAGNOSIS — G8929 Other chronic pain: Secondary | ICD-10-CM

## 2010-11-26 DIAGNOSIS — R946 Abnormal results of thyroid function studies: Secondary | ICD-10-CM | POA: Insufficient documentation

## 2010-11-26 MED ORDER — LEVOTHYROXINE SODIUM 50 MCG PO TABS: 50.0000 ug | ORAL_TABLET | Freq: Every day | ORAL | Status: DC

## 2010-11-26 NOTE — Progress Notes (Signed)
  Subjective:    Patient ID: Jose Stevens, male    DOB: [birth year], 55 y.o.   MRN: [id number]  HPI The  thyroid function tests were reviewed. On 10/08/2010 his TSH was 0.03. His free T4 was 1.68 and free T3 was 3.9. The TSH was therefore suppressed even though the free T4 was not elevated significantly (normal 0.6-1.6).  His TSH is now 12.74 with a free T4 of 0.33 and a free T3 of 2.1.   Medications were reviewed. He is not taking medications which should impact thyroid function. He has taking supplements but no hormonally based products.   Constitutional: Weight change: 12 # gain; Fatigue:no; Sleep pattern:good; Appetite:fair  Visual change(blurred/diplopia/visual loss):no Hoarseness:no; Swallowing issues:no Cardiovascular: Palpitations:no; Racing:no; Irregularity:no GI: Constipation:no; Diarrhea:no Derm: Change in nails/hair/skin:no Neuro: Numbness/tingling:no; Tremor:no Psych: Anxiety:no; Panic attacks:no Endo: Temperature intolerance: Heat:no; Cold:no    Review of Systems     Objective:   Physical Exam  Gen.: well-nourished; in no acute distress Eyes: Extraocular motion intact; no lid lag or proptosis Neck: physiologic asymmetry  ,R > L w/o nodules Heart: Normal rhythm and rate without significant murmur, gallop, or extra heart sounds Lungs: Chest clear to auscultation without rales,rales, wheezes Neuro:Deep tendon reflexes are equal and within normal limits; no tremor  Skin: Warm and dry without significant lesions or rashes; no onycholysis Psych: Normally communicative and interactive; no abnormal mood or affect clinically.         Assessment & Plan:  #1 abnormal thyroid function test. 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  Plan: Synthroid 50 mcg one half daily with repeat thyroid function test in 8-10 weeks. The ultimate goal will be a TSH of 1-3.

## 2010-11-26 NOTE — Patient Instructions (Signed)
Please recheck the thyroid function tests in 8 weeks on Synthroid 50 mcg one half pill daily. Do not take the thyroid supplement within 2 hours of any other medicines or supplements.

## 2010-12-04 ENCOUNTER — Ambulatory Visit: Payer: 59 | Attending: Internal Medicine

## 2010-12-04 DIAGNOSIS — R293 Abnormal posture: Secondary | ICD-10-CM | POA: Insufficient documentation

## 2010-12-04 DIAGNOSIS — M546 Pain in thoracic spine: Secondary | ICD-10-CM | POA: Insufficient documentation

## 2010-12-04 DIAGNOSIS — M25519 Pain in unspecified shoulder: Secondary | ICD-10-CM | POA: Insufficient documentation

## 2010-12-04 DIAGNOSIS — IMO0001 Reserved for inherently not codable concepts without codable children: Secondary | ICD-10-CM | POA: Insufficient documentation

## 2010-12-07 ENCOUNTER — Ambulatory Visit: Payer: 59

## 2010-12-13 ENCOUNTER — Ambulatory Visit: Payer: 59

## 2010-12-13 NOTE — Telephone Encounter (Signed)
error 

## 2010-12-18 ENCOUNTER — Ambulatory Visit: Payer: 59

## 2010-12-20 ENCOUNTER — Ambulatory Visit: Payer: 59

## 2011-01-24 ENCOUNTER — Other Ambulatory Visit: Payer: Self-pay | Admitting: Internal Medicine

## 2011-01-25 ENCOUNTER — Other Ambulatory Visit: Payer: Self-pay | Admitting: Internal Medicine

## 2011-01-25 DIAGNOSIS — E039 Hypothyroidism, unspecified: Secondary | ICD-10-CM

## 2011-01-25 DIAGNOSIS — R946 Abnormal results of thyroid function studies: Secondary | ICD-10-CM

## 2011-01-28 ENCOUNTER — Other Ambulatory Visit (INDEPENDENT_AMBULATORY_CARE_PROVIDER_SITE_OTHER): Payer: 59

## 2011-01-28 DIAGNOSIS — E039 Hypothyroidism, unspecified: Secondary | ICD-10-CM

## 2011-01-28 DIAGNOSIS — R946 Abnormal results of thyroid function studies: Secondary | ICD-10-CM

## 2011-01-28 LAB — T4, FREE: Free T4: 0.82 ng/dL (ref 0.60–1.60)

## 2011-02-05 ENCOUNTER — Encounter: Payer: Self-pay | Admitting: Internal Medicine

## 2011-02-05 ENCOUNTER — Ambulatory Visit (INDEPENDENT_AMBULATORY_CARE_PROVIDER_SITE_OTHER): Payer: 59 | Admitting: Internal Medicine

## 2011-02-05 VITALS — BP 130/88 | HR 72 | Wt 191.8 lb

## 2011-02-05 DIAGNOSIS — R946 Abnormal results of thyroid function studies: Secondary | ICD-10-CM

## 2011-02-05 MED ORDER — LEVOTHYROXINE SODIUM 50 MCG PO TABS: 50.0000 ug | ORAL_TABLET | Freq: Every day | ORAL | Status: DC

## 2011-02-05 NOTE — Patient Instructions (Signed)
Please  schedule fasting Labs in 02-Jan-2023 weeks : Lipids,  TSH. (Codes: 272.4, 794.5)

## 2011-02-05 NOTE — Progress Notes (Signed)
  Subjective:    Patient ID: Jose Stevens, male    DOB: August 10, 1955, 55 y.o.   MRN: [id number]  HPI Thyroid function monitor  Medications status(change in dose/brand/mode of administration):he increased  Thyroid 50 mcg to 1 daily three weeks ago ; TSH 12.74 in 7/12; now 4.74 . Note: 0.03  Three months ago . Constitutional: Weight change: stable; Fatigue:no; Sleep pattern:good; Appetite:good  Visual change(blurred/diplopia/visual loss):no Hoarseness:no; Swallowing issues:no Cardiovascular: Palpitations:no; Racing:no; Irregularity:no GI: Constipation:no; Diarrhea:no Derm: Change in nails/hair/skin:no Neuro: Numbness/tingling:no; Tremor:no Psych: Anxiety:no; Depression:no; Panic attacks:no Endo: Temperature intolerance: Heat:no; Cold:no      Review of Systems     Objective:   Physical Exam  Gen.:  well-nourished; in no acute distress Eyes: Extraocular motion intact; no lid lag or proptosis Neck: thyroid normal  Heart: Normal rhythm and rate without significant murmur, gallop, or extra heart sounds Lungs: Chest clear to auscultation without rales,rales, wheezes Neuro:Deep tendon reflexes are equal and within normal limits; no tremor  Skin: Warm and dry without significant lesions or rashes; no onycholysis Psych: Normally communicative and interactive; no abnormal mood or affect clinically.          Assessment & Plan:  #1 abnormal  Thyroid function tests , probably hypothyroidism after acute thyroiditis resolution Plan: no change; recheck TSH in 8-10 weeks

## 2011-03-04 LAB — CBC
HCT: 43
Platelets: 191
RBC: 5.04
WBC: 4.7

## 2011-03-04 LAB — COMPREHENSIVE METABOLIC PANEL
ALT: 23
BUN: 11
Calcium: 8.8
Glucose, Bld: 96
Sodium: 136
Total Protein: 6.7

## 2011-03-04 LAB — PROTIME-INR
INR: 0.9
Prothrombin Time: 12.6

## 2011-04-01 ENCOUNTER — Other Ambulatory Visit: Payer: Self-pay | Admitting: Internal Medicine

## 2011-04-01 DIAGNOSIS — R946 Abnormal results of thyroid function studies: Secondary | ICD-10-CM

## 2011-04-01 DIAGNOSIS — E785 Hyperlipidemia, unspecified: Secondary | ICD-10-CM

## 2011-04-02 ENCOUNTER — Other Ambulatory Visit (INDEPENDENT_AMBULATORY_CARE_PROVIDER_SITE_OTHER): Payer: 59

## 2011-04-02 DIAGNOSIS — E785 Hyperlipidemia, unspecified: Secondary | ICD-10-CM

## 2011-04-02 DIAGNOSIS — R946 Abnormal results of thyroid function studies: Secondary | ICD-10-CM

## 2011-04-02 LAB — LIPID PANEL
Cholesterol: 147 mg/dL (ref 0–200)
Triglycerides: 89 mg/dL (ref 0.0–149.0)

## 2011-04-02 NOTE — Progress Notes (Signed)
12  

## 2011-04-04 ENCOUNTER — Encounter: Payer: Self-pay | Admitting: Internal Medicine

## 2011-04-05 ENCOUNTER — Ambulatory Visit (INDEPENDENT_AMBULATORY_CARE_PROVIDER_SITE_OTHER): Payer: 59 | Admitting: Internal Medicine

## 2011-04-05 ENCOUNTER — Encounter: Payer: Self-pay | Admitting: Internal Medicine

## 2011-04-05 DIAGNOSIS — E785 Hyperlipidemia, unspecified: Secondary | ICD-10-CM

## 2011-04-05 DIAGNOSIS — E039 Hypothyroidism, unspecified: Secondary | ICD-10-CM | POA: Insufficient documentation

## 2011-04-05 DIAGNOSIS — M542 Cervicalgia: Secondary | ICD-10-CM

## 2011-04-05 DIAGNOSIS — E079 Disorder of thyroid, unspecified: Secondary | ICD-10-CM

## 2011-04-05 NOTE — Patient Instructions (Addendum)
Please  schedule  Labs :  TSH in 6 months & lipids in 12 months. Please bring these instructions to those Lab appt.  Please keep a diary concerning the neck pain.If this indeed occurs with or after exercise; I recommend a stress test.

## 2011-04-05 NOTE — Progress Notes (Signed)
  Subjective:    Patient ID: Jose Stevens, male    DOB: 1955-05-23, 55 y.o.   MRN: [id number]  HPI Dyslipidemia assessment: Prior Advanced Lipid Testing: NMR Lipoprofile.   Family history of premature CAD/ MI: MGF MI @ 48 .  Nutrition: heart healthy .  Exercise: 6 X/ week for 90 min . Diabetes : no . HTN: no. Smoking history  : quit 1986 .   Weight :  stable. ROS: fatigue: no ; chest pain : no ;claudication: no; palpitations: no; abd pain/bowel changes: no ; myalgias:no;  syncope : no ; memory loss: no Lab results reviewed :LDL 74 on Crestor 20 mg daily.   Thyroid function monitor : TSH 2.65 on 50 mcg Synthroid Medications status(change in dose/brand/mode of administration):no Constitutional: Sleep pattern:good; Appetite:good  Visual change(blurred/diplopia/visual loss):no Hoarseness:no; Swallowing issues:no Derm: Change in nails/hair/skin:no Neuro: Numbness/tingling:no; Tremor:no Psych: Anxiety:no; Depression:no Endo: Temperature intolerance: Heat:no; Cold:no     Review of Systems Possibly related to his exercise,he's noticed intermittent soreness in the neck bilaterally below the ears. He denies any anginal type chest  pain even at high levels of exercise ( see above)     Objective:   Physical Exam  Gen.: muscled & well-nourished; in no acute distress Eyes: Extraocular motion intact; no lid lag or proptosis   Neck: no nodules or enlargement. NWG:NFAOZH & TMs normal. I find no abnormalities of the mandibular area or temporomandibular joints. Heart: Normal rhythm and rate without significant murmur, gallop, or extra heart sounds Lungs: Chest clear to auscultation without rales,rales, wheezes Neuro:Deep tendon reflexes are equal and within normal limits; no tremor  Skin: Warm and dry without significant lesions or rashes; no onycholysis Psych: Normally communicative and interactive; no abnormal mood or affect clinically.         Assessment & Plan:  #1 dyslipidemia;  his LDL is at ideal goal. No change in medications indicated  #2 thyroid disease with mild goiter formation on ultrasound previously. The serial thyroid function tests suggest hyperthyroidism which resolved spontaneously but mophed into hypothyroidism. His TSH is now therapeutic. The TSH should be monitored every 6 months. If this remains stable, annual  monitor would be  appropriate  #3 atypical jaw pain; if he relates this to exercise then I would recommend a stress test.

## 2011-04-30 ENCOUNTER — Ambulatory Visit (INDEPENDENT_AMBULATORY_CARE_PROVIDER_SITE_OTHER): Payer: 59

## 2011-04-30 DIAGNOSIS — Z23 Encounter for immunization: Secondary | ICD-10-CM

## 2011-06-17 ENCOUNTER — Other Ambulatory Visit: Payer: Self-pay | Admitting: Internal Medicine

## 2011-09-23 ENCOUNTER — Other Ambulatory Visit: Payer: Self-pay | Admitting: Internal Medicine

## 2011-10-03 ENCOUNTER — Other Ambulatory Visit (INDEPENDENT_AMBULATORY_CARE_PROVIDER_SITE_OTHER): Payer: 59

## 2011-10-03 DIAGNOSIS — E079 Disorder of thyroid, unspecified: Secondary | ICD-10-CM

## 2011-10-03 NOTE — Progress Notes (Signed)
Labs only

## 2011-10-04 ENCOUNTER — Encounter: Payer: Self-pay | Admitting: Family

## 2011-10-04 ENCOUNTER — Ambulatory Visit (INDEPENDENT_AMBULATORY_CARE_PROVIDER_SITE_OTHER): Payer: 59 | Admitting: Family

## 2011-10-04 ENCOUNTER — Ambulatory Visit: Payer: 59 | Admitting: Family

## 2011-10-04 VITALS — BP 122/86 | HR 103 | Temp 98.2°F | Resp 16 | Wt 191.0 lb

## 2011-10-04 DIAGNOSIS — J329 Chronic sinusitis, unspecified: Secondary | ICD-10-CM

## 2011-10-04 MED ORDER — AMOXICILLIN-POT CLAVULANATE 875-125 MG PO TABS: 1.0000 | ORAL_TABLET | Freq: Two times a day (BID) | ORAL | Status: AC

## 2011-10-04 NOTE — Progress Notes (Signed)
Subjective:    Patient ID: Jose Stevens, male    DOB: 09-01-1955, 56 y.o.   MRN: [id number]  HPI  Mr.  Stevens is a 56 yr old male who presents today with chief complaint of sinus congestion.  He reports congestion started about 1 week ago.  Reports brown/yellow/green nasal discharge. Some mild cough.  No significant sinus pressure. Reports sweating.  Did not take his temperature.   Has not taken any otc medications.Review of Systems  See HPI  Past Medical History  Diagnosis Date  . Hyperlipemia     [place] study LDL goal= <160. NMR Lipoprofile 2007: LDL 188 (2618/2018) HDL 38, TG 127. LDL goal= <100, ideally <75  . Rhinitis   . Prostate cancer     Dr Brunilda Payor  . Hemorrhoids   . Allergy     rhinitis    History   Social History  . Marital Status: Married    Spouse Name: N/A    Number of Children: N/A  . Years of Education: N/A   Occupational History  . Not on file.   Social History Main Topics  . Smoking status: Former Smoker    Quit date: 05/20/1984  . Smokeless tobacco: Never Used  . Alcohol Use: Yes     socially  . Drug Use: No  . Sexually Active: Not on file   Other Topics Concern  . Not on file   Social History Narrative  . No narrative on file    Past Surgical History  Procedure Date  . Prostatectomy     s/p radiation, Dr.Nesi  . Tonsillectomy     Family History  Problem Relation Age of Onset  . Sudden death Father     Suicide  . Lung cancer Mother   . Cancer Mother     lung  . Prostate cancer    . Heart attack Maternal Uncle 63  . Heart disease Maternal Uncle 63    MI  . Heart attack Maternal Grandfather 56  . Heart disease Maternal Grandfather 56    MI  . Colon cancer Neg Hx   . Hyperlipidemia Brother     dyslipidemia  . Heart disease Brother     5 vessel CABG  . Cancer Brother     PROSTATE    No Known Allergies  Current Outpatient Prescriptions on File Prior to Visit  Medication Sig Dispense Refill  . AMBULATORY NON  FORMULARY MEDICATION [employer] Drink 1/2 bottle prior to working out       . aspirin 81 MG tablet Take 81 mg by mouth daily.        . CRESTOR 20 MG tablet TAKE 1 TABLET BY MOUTH ONCE DAILY  90 tablet  1  . Fish Oil OIL by Does not apply route daily.        Marland Kitchen loratadine (CLARITIN) 10 MG tablet Take 10 mg by mouth daily as needed.      Marland Kitchen omeprazole (PRILOSEC) 20 MG capsule TAKE 1 CAPSULE BY MOUTH ONCE DAILY  90 capsule  2  . Probiotic Product (PROBIOTIC FORMULA) CAPS Take by mouth daily.        . sildenafil (VIAGRA) 100 MG tablet Take 100 mg by mouth daily as needed.        Marland Kitchen SYNTHROID 50 MCG tablet TAKE 1 TABLET BY MOUTH DAILY.  90 tablet  1  . AMBULATORY NON FORMULARY MEDICATION C4 energy boost, prior to working out       . Bee Pollen  500 MG TABS Take by mouth as directed. 2 by mouth once daily       . Pseudoephedrine-Guaifenesin (MUCINEX D PO) Take by mouth.        . DISCONTD: levothyroxine (SYNTHROID) 50 MCG tablet Take 1 tablet (50 mcg total) by mouth daily. 1/2 daily  56 tablet  0   Current Facility-Administered Medications on File Prior to Visit  Medication Dose Route Frequency Provider Last Rate Last Dose  . 0.9 %  sodium chloride infusion  500 mL Intravenous Continuous Hilarie Fredrickson, MD        BP 122/86  Pulse 103  Temp(Src) 98.2 F (36.8 C) (Oral)  Resp 16  Wt 191 lb (86.637 kg)  SpO2 97%       Objective:   Physical Exam  Constitutional: He appears well-developed and well-nourished. No distress.  HENT:  Head: Normocephalic and atraumatic.  Right Ear: Tympanic membrane and ear canal normal.  Left Ear: Tympanic membrane and ear canal normal.  Mouth/Throat: Uvula is midline, oropharynx is clear and moist and mucous membranes are normal. No oropharyngeal exudate, posterior oropharyngeal edema, posterior oropharyngeal erythema or tonsillar abscesses.  Cardiovascular: Normal rate and regular rhythm.   No murmur heard. Pulmonary/Chest: Effort normal and breath sounds  normal. No respiratory distress. He has no wheezes. He has no rales. He exhibits no tenderness.  Psychiatric: He has a normal mood and affect. His behavior is normal. Judgment and thought content normal.          Assessment & Plan:

## 2011-10-04 NOTE — Patient Instructions (Signed)

## 2011-10-08 DIAGNOSIS — J329 Chronic sinusitis, unspecified: Secondary | ICD-10-CM | POA: Insufficient documentation

## 2011-10-08 NOTE — Assessment & Plan Note (Signed)
Will rx with Augmentin. 

## 2011-11-15 ENCOUNTER — Ambulatory Visit (INDEPENDENT_AMBULATORY_CARE_PROVIDER_SITE_OTHER): Payer: 59 | Admitting: Internal Medicine

## 2011-11-15 ENCOUNTER — Encounter: Payer: Self-pay | Admitting: Internal Medicine

## 2011-11-15 VITALS — BP 130/80 | HR 80 | Temp 98.5°F | Wt 187.0 lb

## 2011-11-15 DIAGNOSIS — R42 Dizziness and giddiness: Secondary | ICD-10-CM

## 2011-11-15 DIAGNOSIS — Z8719 Personal history of other diseases of the digestive system: Secondary | ICD-10-CM

## 2011-11-15 LAB — CBC WITH DIFFERENTIAL/PLATELET
Basophils Relative: 0.5 % (ref 0.0–3.0)
Eosinophils Relative: 2.6 % (ref 0.0–5.0)
HCT: 44 % (ref 39.0–52.0)
Lymphs Abs: 1 10*3/uL (ref 0.7–4.0)
MCV: 88.4 fl (ref 78.0–100.0)
Monocytes Absolute: 0.4 10*3/uL (ref 0.1–1.0)
RBC: 4.98 Mil/uL (ref 4.22–5.81)
WBC: 4.4 10*3/uL — ABNORMAL LOW (ref 4.5–10.5)

## 2011-11-15 NOTE — Patient Instructions (Addendum)
Repeat the isometric exercises discussed 4- 5 times prior to standing if you've been lying or seated for a period of time. Please try to go on My Chart within the next 24 hours to allow me to release the results directly to you.

## 2011-11-15 NOTE — Progress Notes (Signed)
  Subjective:    Patient ID: Jose Stevens, male    DOB: 19-Jul-1955, 56 y.o.   MRN: [id number]  HPI Upon standing from doing sit-ups  June 11/11/11 he experienced acute dizziness. He is unsure as to whether he had any true vertigo. He lay back down immediately. He continued to have some symptoms since Monday but there's been gradual improvement with resting.  There was no cardiac or neurologic prodrome prior to the episode. It was not related to straining or pain. The week prior to this episode he had noticed some hearing loss without tinnitus.    Review of Systems No palpitations, irregular rhythm, heart rate change No headache, numbness and tingling, weakness, change in coordination (gait/falling) Syncope:no Seizure activity:no Upper respiratory tract infection/extrinsic symptoms :treated for URI:3 weeks ago . He denies frontal headache, facial pain, nasal purulence, itchy/watery eyes, or sneezing. Visual change (blurred/double/loss):no Nausea/sweating:no Chest pain:no Dyspnea:no  He did have some rectal bleeding several months ago. Colonoscopy was negative last year. His urologist has documented hemorrhoids. Past medical history/family history/social history were all reviewed and updated.         Objective:   Physical Exam  Gen. appearance: Well-nourished, in no distress Eyes: Extraocular motion intact, field of vision normal, vision grossly intact with lenses, no nystagmus ENT: Canals clear, tympanic membranes normal, tuning fork exam normal, hearing grossly normal Neck: Normal range of motion, no masses, normal thyroid Cardiovascular: Rate and rhythm normal; no murmurs, gallops or extra heart sounds Muscle skeletal: Range of motion, tone, &  strength normal Neuro:no cranial nerve deficit, deep tendon  reflexes normal, gait normal. Symptoms cannot be elicited with lateral rotation of the head and assuming the supine position. He had minor symptoms he arose quickly and stood.  Romberg and finger to nose testing normal  Lymph: No cervical or axillary LA Skin: Warm and dry without suspicious lesions or rashes Psych: no anxiety or mood change. Normally interactive and cooperative.         Assessment & Plan:   #1 positional dizziness; the most likely etiology is postural hypotension. Isometric exercises pre standing is recommended. History and physical do not suggest benign positional vertigo. True vertigo is also not suggested. No neurologic deficit present  #2 isolated rectal bleeding, probably hemorrhoidal. CBC will be checked.

## 2012-01-22 ENCOUNTER — Telehealth: Payer: Self-pay | Admitting: *Deleted

## 2012-01-22 MED ORDER — LEVOTHYROXINE SODIUM 50 MCG PO TABS: ORAL_TABLET | ORAL | Status: DC

## 2012-01-22 NOTE — Telephone Encounter (Signed)
Generic okay; same dose, #90. Recheck TSH after 10 weeks to verify efficacy. Code 244.9

## 2012-01-22 NOTE — Telephone Encounter (Signed)
Pt state that brand name synthroid is too expensive. Pt would like to know if it would be ok to change to generic. Marland KitchenPlease advise

## 2012-01-22 NOTE — Telephone Encounter (Signed)
Discuss with patient, Rx sent. 

## 2012-02-12 ENCOUNTER — Other Ambulatory Visit: Payer: Self-pay | Admitting: Internal Medicine

## 2012-02-13 ENCOUNTER — Encounter: Payer: Self-pay | Admitting: Internal Medicine

## 2012-02-13 ENCOUNTER — Ambulatory Visit (INDEPENDENT_AMBULATORY_CARE_PROVIDER_SITE_OTHER): Payer: 59 | Admitting: Internal Medicine

## 2012-02-13 VITALS — BP 134/82 | HR 86 | Wt 188.0 lb

## 2012-02-13 DIAGNOSIS — S59909A Unspecified injury of unspecified elbow, initial encounter: Secondary | ICD-10-CM

## 2012-02-13 DIAGNOSIS — Z23 Encounter for immunization: Secondary | ICD-10-CM

## 2012-02-13 DIAGNOSIS — S59919A Unspecified injury of unspecified forearm, initial encounter: Secondary | ICD-10-CM

## 2012-02-13 MED ORDER — CELECOXIB 200 MG PO CAPS: 200.0000 mg | ORAL_CAPSULE | Freq: Two times a day (BID) | ORAL | Status: DC

## 2012-02-13 NOTE — Progress Notes (Signed)
  Subjective:    Patient ID: Jose Stevens, male    DOB: [birth year], 56 y.o.   MRN: [id number]  HPI   He injured his right proximal forearm area while performing a pullup & pronating the forearm  02/06/12. He felt a "pop" immediately. He's had persistent discomfort in this area despite using a topical over-the-counter elastic tape.Marland Kitchen  He did ice the area for the first 48 hours. Aleve has been of some benefit   Review of Systems  He denies fever, chills, sweats, bruising, or change in temperature or color of the skin.     Objective:   Physical Exam  He is well muscled, healthy and well-nourished.  The radial  pulses are equal.  There is full range of motion at the elbow. There's no tenderness to palpation over the proximal forearm.  Deep tendon reflexes, strength, and tone are normal. He does have pain with pronation of the forearm. No skin changes are present.  He has no axillary or epitrochlear lymphadenopathy        Assessment & Plan:  #1 soft tissue injury right forearm.  Plan: See orders and recommendations. Sports medicine referral if symptoms persist

## 2012-02-13 NOTE — Telephone Encounter (Signed)
Rx sent.    MW 

## 2012-02-13 NOTE — Patient Instructions (Addendum)
Consider glucosamine sulfate 1500 mg daily for joint symptoms. Take this daily  for 3 months and then leave it off for 2 months. This will rehydrate the cartilages.  Use a nitroglycerin glycerin patch every 12 hours for 7 days. Take the Celebrex samples 1 twice a day for 3 days

## 2012-04-07 ENCOUNTER — Other Ambulatory Visit: Payer: Self-pay | Admitting: Internal Medicine

## 2012-04-07 NOTE — Telephone Encounter (Signed)
Rx sent.    MW 

## 2012-04-09 ENCOUNTER — Other Ambulatory Visit: Payer: Self-pay | Admitting: Internal Medicine

## 2012-04-09 NOTE — Telephone Encounter (Signed)
Rx sent.    MW 

## 2012-07-16 ENCOUNTER — Other Ambulatory Visit: Payer: Self-pay | Admitting: Internal Medicine

## 2012-11-07 IMAGING — US US SOFT TISSUE HEAD/NECK
1 series · 14 of 25 positions shown · non-contrast
Comparison: None.

CLINICAL DATA: Tenderness overlying the thyroid.

THYROID ULTRASOUND
TECHNIQUE: Ultrasound examination of the thyroid gland and adjacent
soft tissues was performed.

[Series 1: us soft tissue head/neck · 0.09mm/px · 14 of 28 slices shown]
[im 1/28]
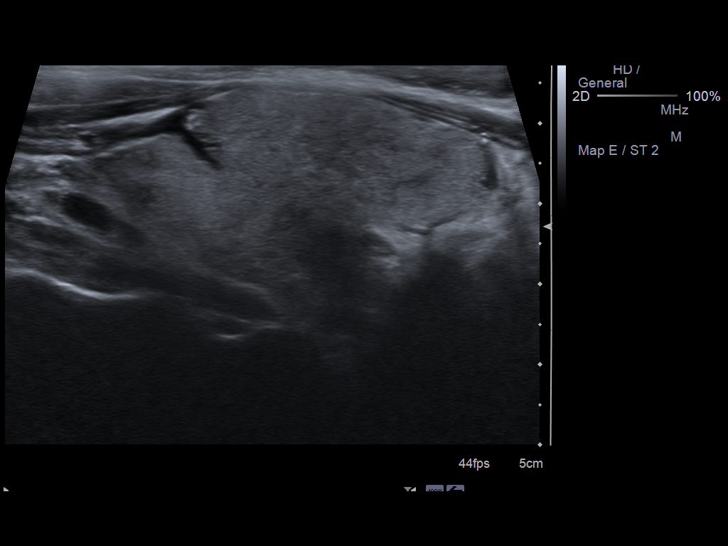
[im 3/28]
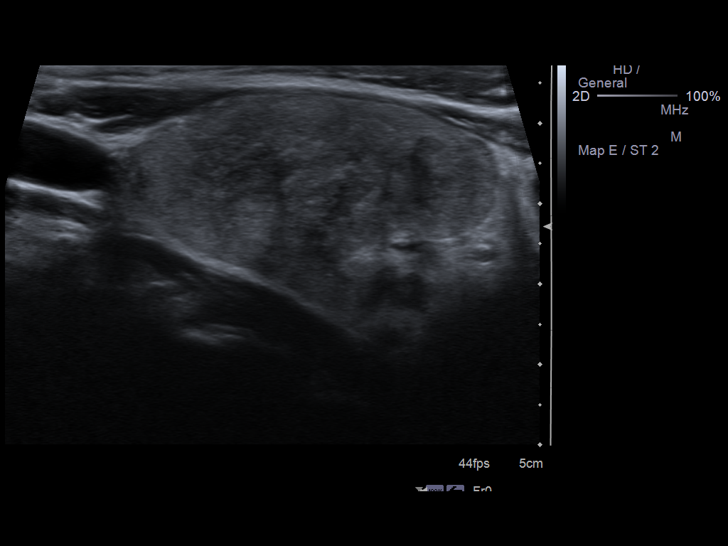
[im 5/28]
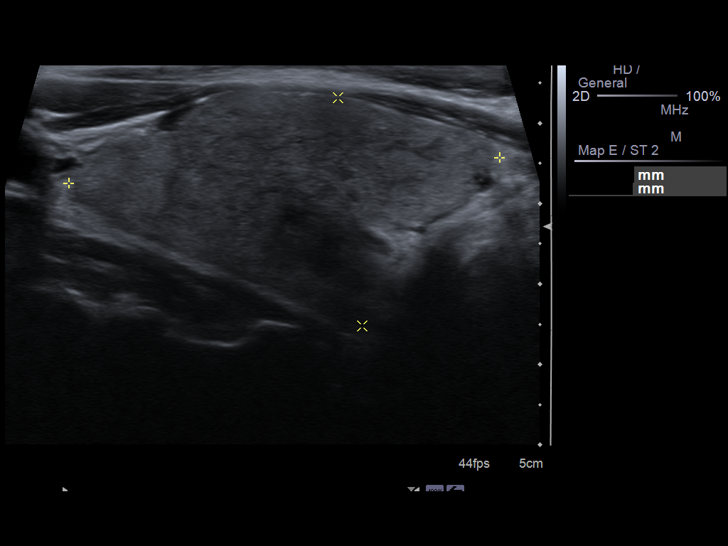
[im 7/28]
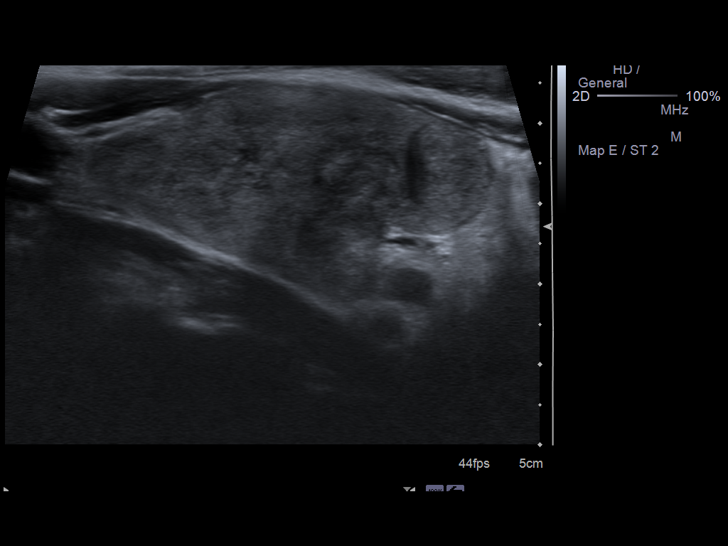
[im 10/28]
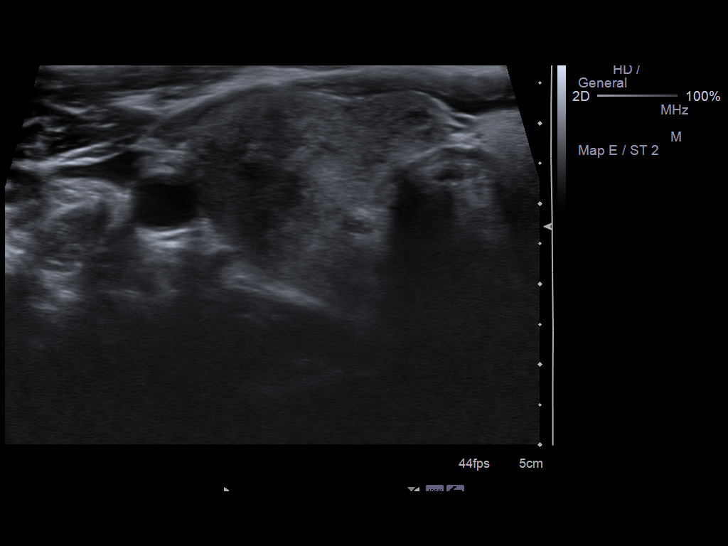
[im 11/28]
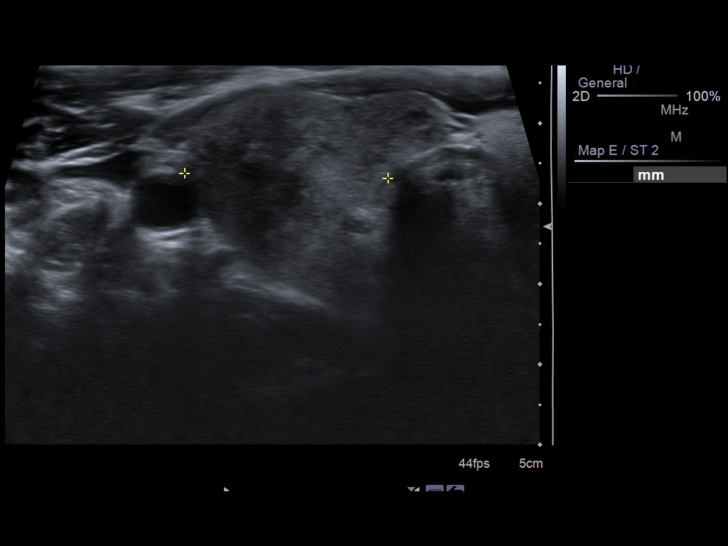
[im 13/28]
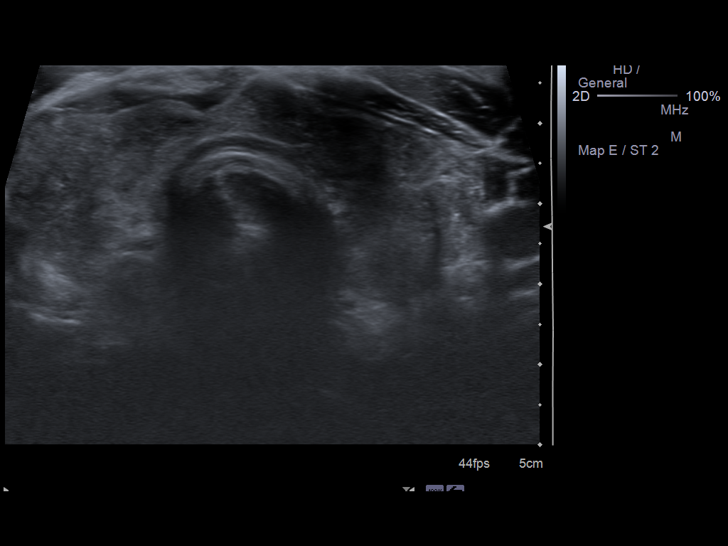
[im 15/28]
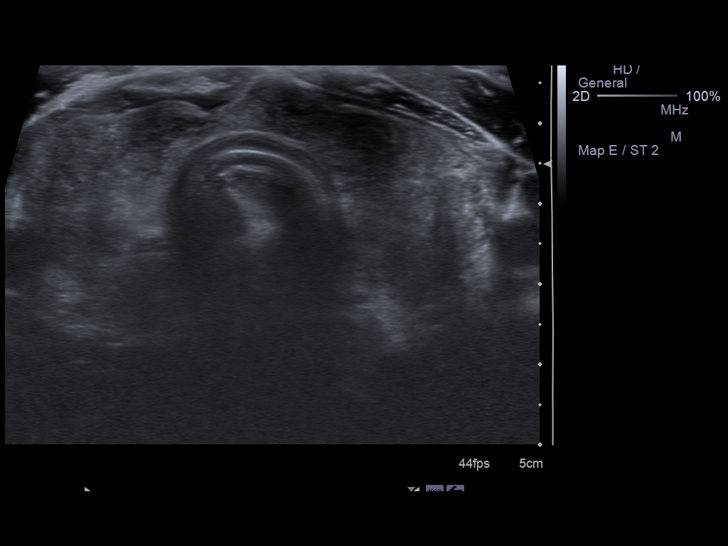
[im 17/28]
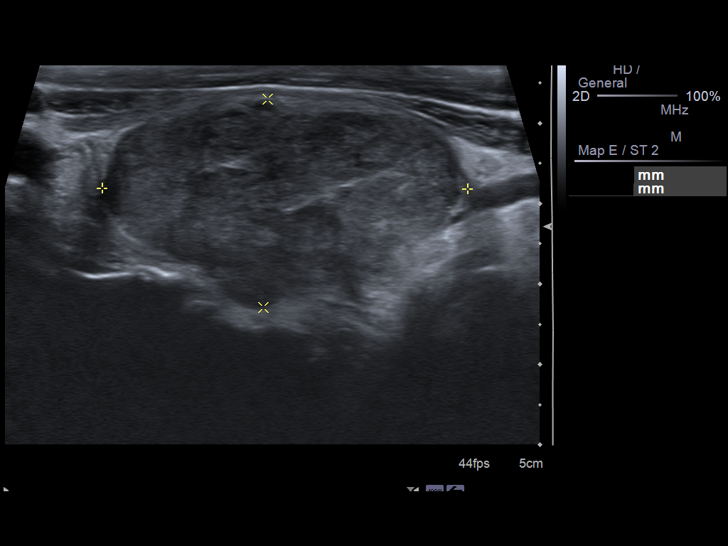
[im 19/28]
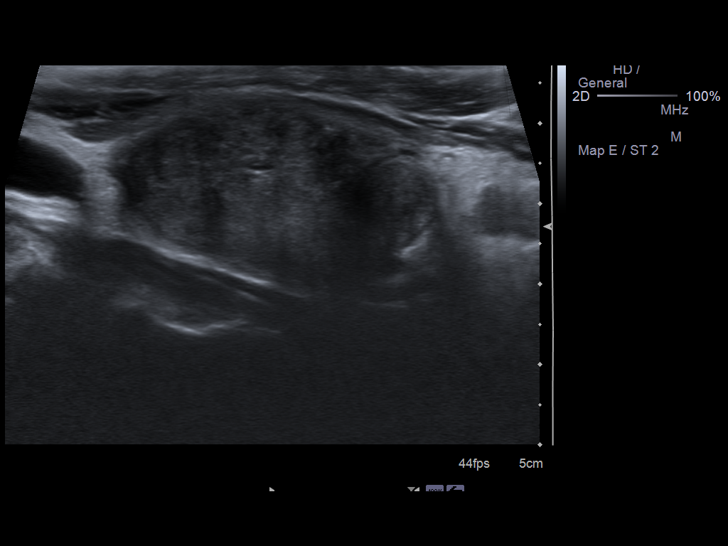
[im 21/28]
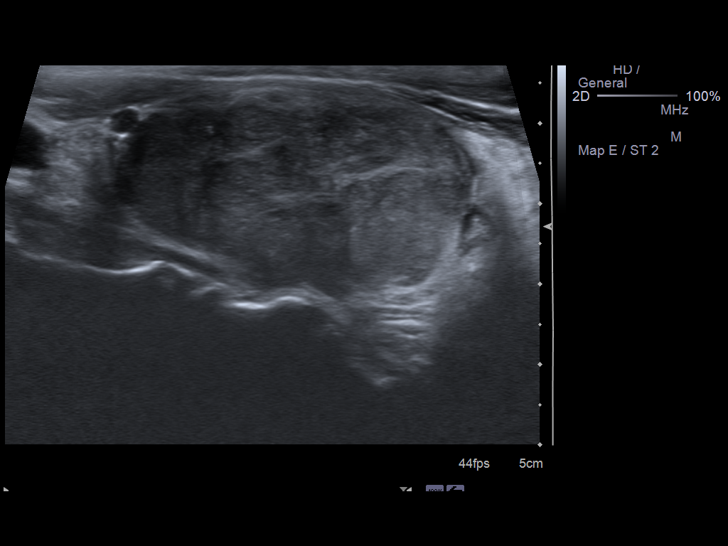
[im 23/28]
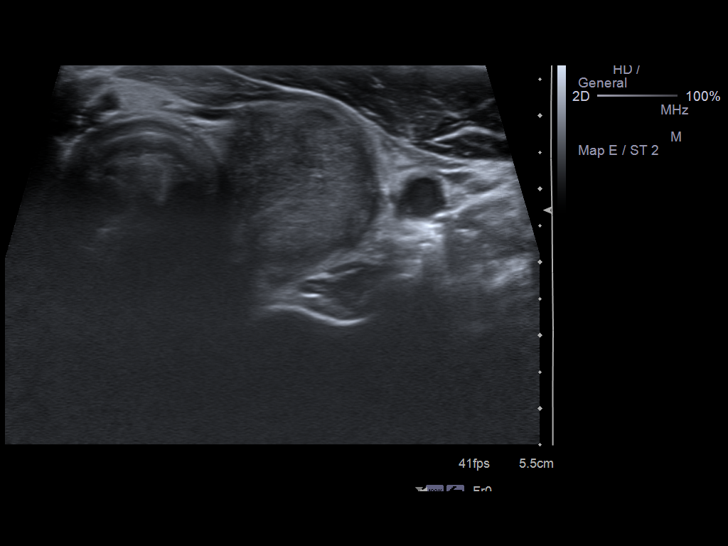
[im 25/28]
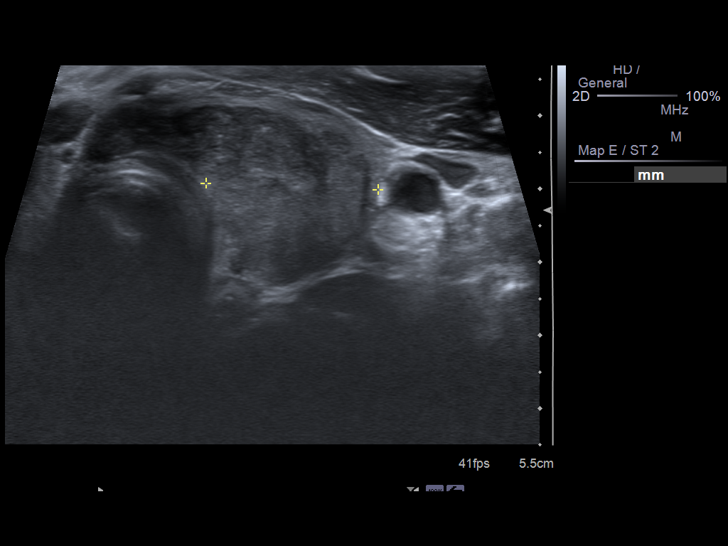
[im 28/28]
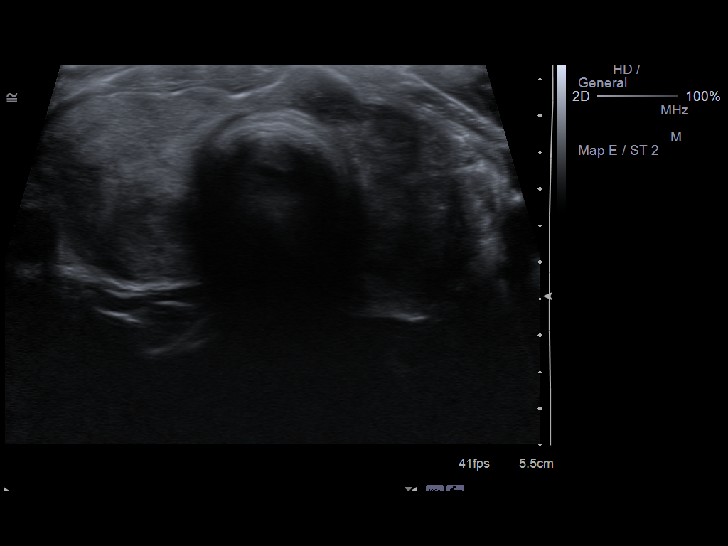

[14 of 25 positions shown; findings below may reference images not displayed]

FINDINGS: Right thyroid lobe:  5.4 x 2.9 x 2.5 cm
Left thyroid lobe:  4.5 x 2.6 x 2.4 cm
Isthmus:  0.7 cm

Focal nodules:  Thyroid gland is mildly enlarged and shows nodular,
heterogeneous echotexture.  Both lobes are nodular in appearance
without discrete measurable nodule.  Findings are consistent with
goiter.

Lymphadenopathy:  None visualized.
IMPRESSION: Mildly enlarged thyroid gland which is very heterogeneous and
nodular in appearance consistent with goiter.  No focal dominant
measurable nodule is identified.

## 2012-11-11 ENCOUNTER — Other Ambulatory Visit: Payer: Self-pay | Admitting: Internal Medicine

## 2012-11-11 NOTE — Telephone Encounter (Signed)
Spoke with patient, scheduled pt. For CPE per protocol. Refill done for 1 month per protocol.

## 2012-11-23 ENCOUNTER — Telehealth: Payer: Self-pay | Admitting: Internal Medicine

## 2012-11-23 NOTE — Telephone Encounter (Signed)
Patient is calling to request more Crestor samples. He has 6 remaining. Next appointment on 12/02/12.

## 2012-11-23 NOTE — Telephone Encounter (Signed)
Pt aware.

## 2012-11-23 NOTE — Telephone Encounter (Signed)
2 packs OK

## 2012-12-02 ENCOUNTER — Encounter: Payer: Self-pay | Admitting: Internal Medicine

## 2012-12-02 ENCOUNTER — Ambulatory Visit (INDEPENDENT_AMBULATORY_CARE_PROVIDER_SITE_OTHER): Payer: 59 | Admitting: Internal Medicine

## 2012-12-02 VITALS — BP 132/88 | HR 79 | Temp 98.2°F | Resp 12 | Ht 66.0 in | Wt 179.0 lb

## 2012-12-02 DIAGNOSIS — E785 Hyperlipidemia, unspecified: Secondary | ICD-10-CM

## 2012-12-02 DIAGNOSIS — Z0000 Encounter for general adult medical examination without abnormal findings: Secondary | ICD-10-CM

## 2012-12-02 DIAGNOSIS — E039 Hypothyroidism, unspecified: Secondary | ICD-10-CM

## 2012-12-02 LAB — CBC WITH DIFFERENTIAL/PLATELET
Basophils Absolute: 0 10*3/uL (ref 0.0–0.1)
Eosinophils Absolute: 0.1 10*3/uL (ref 0.0–0.7)
HCT: 44.5 % (ref 39.0–52.0)
Lymphs Abs: 0.8 10*3/uL (ref 0.7–4.0)
MCHC: 34.2 g/dL (ref 30.0–36.0)
Monocytes Relative: 10.2 % (ref 3.0–12.0)
Platelets: 211 10*3/uL (ref 150.0–400.0)
RDW: 14.1 % (ref 11.5–14.6)

## 2012-12-02 LAB — LIPID PANEL
Cholesterol: 130 mg/dL (ref 0–200)
HDL: 51.1 mg/dL (ref >39.00)
LDL Cholesterol: 69 mg/dL (ref 0–99)
Triglycerides: 52 mg/dL (ref 0.0–149.0)
VLDL: 10.4 mg/dL (ref 0.0–40.0)

## 2012-12-02 LAB — BASIC METABOLIC PANEL
BUN: 7 mg/dL (ref 6–23)
Calcium: 9.1 mg/dL (ref 8.4–10.5)
GFR: 110.94 mL/min (ref >60.00)
Glucose, Bld: 86 mg/dL (ref 70–99)
Potassium: 3.6 mEq/L (ref 3.5–5.1)

## 2012-12-02 LAB — HEPATIC FUNCTION PANEL
AST: 36 U/L (ref 0–37)
Total Bilirubin: 1.4 mg/dL — ABNORMAL HIGH (ref 0.3–1.2)

## 2012-12-02 MED ORDER — LEVOTHYROXINE SODIUM 50 MCG PO TABS: ORAL_TABLET | ORAL | Status: DC

## 2012-12-02 MED ORDER — ROSUVASTATIN CALCIUM 20 MG PO TABS: ORAL_TABLET | ORAL | Status: DC

## 2012-12-02 NOTE — Progress Notes (Signed)
  Subjective:    Patient ID: Jose Stevens, male    DOB: 07-14-1955, 57 y.o.   MRN: [id number]  HPI  He  is here for a physical;acute issues denied.     Review of Systems  He is on a heart healthy diet; he exercises 60-90  minutes 5-6 times per week without symptoms. Specifically he denies chest pain, palpitations, dyspnea, or claudication. Family history is strongly positive  for coronary artery  disease. Advanced cholesterol testing reveals his LDL goal is less than 100, ideally < 70.      Objective:   Physical Exam Gen.: Thin but well muscled & healthy and well-nourished in appearance. Alert, appropriate and cooperative throughout exam. Appears younger than stated age  Head: Normocephalic without obvious abnormalities. Head shaven  Eyes: No corneal or conjunctival inflammation noted. Extraocular motion intact. Vision grossly normal with lenses Ears: External  ear exam reveals no significant lesions or deformities. Canals clear .TMs normal. Hearing is grossly normal bilaterally. Nose: External nasal exam reveals no deformity or inflammation. Nasal mucosa are pink and moist. No lesions or exudates noted.   Mouth: Oral mucosa and oropharynx reveal no lesions or exudates. Teeth in good repair. Lower partial Neck: No deformities, masses, or tenderness noted. Range of motion & Thyroid normal. Lungs: Normal respiratory effort; chest expands symmetrically. Lungs are clear to auscultation without rales, wheezes, or increased work of breathing. Heart: Normal rate and rhythm. Normal S1 and S2. No gallop, click, or rub. Grade 1/2-1 over 6 systolic murmur Abdomen: Bowel sounds normal; abdomen soft and nontender. No masses, organomegaly or hernias noted. Genitalia:As per Dr Brunilda Payor                                  Musculoskeletal/extremities: No deformity or scoliosis noted of  the thoracic or lumbar spine.  No clubbing, cyanosis, edema, or significant extremity  deformity noted. Range of motion normal  .Tone & strength  Normal. Joints normal. Nail health good. Able to lie down & sit up w/o help. Negative SLR bilaterally Vascular: Carotid, radial artery, dorsalis pedis and  posterior tibial pulses are full and equal. No bruits present. Neurologic: Alert and oriented x3. Deep tendon reflexes symmetrical and normal.        Skin: Intact without suspicious lesions or rashes. Lymph: No cervical, axillary lymphadenopathy present. Psych: Mood and affect are normal. Normally interactive                                                                                        Assessment & Plan:  #1 comprehensive physical exam; no acute findings  Plan: see Orders  & Recommendations

## 2012-12-02 NOTE — Patient Instructions (Addendum)
Share results with all non [place] medical staff seen  

## 2013-02-08 ENCOUNTER — Telehealth: Payer: Self-pay | Admitting: Internal Medicine

## 2013-02-08 DIAGNOSIS — E039 Hypothyroidism, unspecified: Secondary | ICD-10-CM

## 2013-02-08 MED ORDER — LEVOTHYROXINE SODIUM 50 MCG PO TABS: ORAL_TABLET | ORAL | Status: DC

## 2013-02-08 NOTE — Telephone Encounter (Signed)
Rx was phoned-in to the pharmacy to Jose Stevens), and [facility] informing the pt that his meds have been refilled.//AB/CMA

## 2013-02-08 NOTE — Telephone Encounter (Signed)
02/08/2013  Pt came by office to get medication refill on levothyroxine (SYNTHROID, LEVOTHROID) 50 MCG tablet.  Pt is out of medication.  Please fax prescription to [facility] pharmacy at [facility].  bw

## 2013-03-25 ENCOUNTER — Other Ambulatory Visit: Payer: Self-pay

## 2013-05-03 ENCOUNTER — Other Ambulatory Visit: Payer: Self-pay | Admitting: Internal Medicine

## 2013-05-04 NOTE — Telephone Encounter (Signed)
Med filled.  

## 2013-05-19 ENCOUNTER — Ambulatory Visit (INDEPENDENT_AMBULATORY_CARE_PROVIDER_SITE_OTHER): Payer: 59

## 2013-05-19 DIAGNOSIS — Z23 Encounter for immunization: Secondary | ICD-10-CM

## 2014-02-10 ENCOUNTER — Other Ambulatory Visit: Payer: Self-pay | Admitting: Internal Medicine

## 2014-03-03 ENCOUNTER — Telehealth: Payer: Self-pay | Admitting: Internal Medicine

## 2014-03-03 MED ORDER — LEVOTHYROXINE SODIUM 50 MCG PO TABS: ORAL_TABLET | ORAL | Status: DC

## 2014-03-03 MED ORDER — OMEPRAZOLE 20 MG PO CPDR: DELAYED_RELEASE_CAPSULE | ORAL | Status: DC

## 2014-03-03 NOTE — Telephone Encounter (Signed)
Patient need a prescription refill of Omeprazole 20 mg Levothyroxine 50 mg.

## 2014-03-03 NOTE — Telephone Encounter (Signed)
In September patient was notified that an office visit is required for additional refills. Since patient can not just stop the medication will send in a decreased qty until appt is made. Prescriptions sent qty 15 on each

## 2014-03-09 ENCOUNTER — Ambulatory Visit (INDEPENDENT_AMBULATORY_CARE_PROVIDER_SITE_OTHER): Payer: PRIVATE HEALTH INSURANCE | Admitting: Internal Medicine

## 2014-03-09 ENCOUNTER — Encounter: Payer: Self-pay | Admitting: Internal Medicine

## 2014-03-09 ENCOUNTER — Other Ambulatory Visit (INDEPENDENT_AMBULATORY_CARE_PROVIDER_SITE_OTHER): Payer: PRIVATE HEALTH INSURANCE

## 2014-03-09 VITALS — BP 136/90 | HR 75 | Temp 98.1°F | Resp 12 | Wt 188.5 lb

## 2014-03-09 DIAGNOSIS — R030 Elevated blood-pressure reading, without diagnosis of hypertension: Secondary | ICD-10-CM

## 2014-03-09 DIAGNOSIS — E079 Disorder of thyroid, unspecified: Secondary | ICD-10-CM

## 2014-03-09 DIAGNOSIS — E785 Hyperlipidemia, unspecified: Secondary | ICD-10-CM

## 2014-03-09 DIAGNOSIS — K219 Gastro-esophageal reflux disease without esophagitis: Secondary | ICD-10-CM

## 2014-03-09 LAB — CBC WITH DIFFERENTIAL/PLATELET
BASOS PCT: 0.6 % (ref 0.0–3.0)
Basophils Absolute: 0 10*3/uL (ref 0.0–0.1)
EOS ABS: 0.2 10*3/uL (ref 0.0–0.7)
Eosinophils Relative: 2.8 % (ref 0.0–5.0)
HCT: 47.8 % (ref 39.0–52.0)
HEMOGLOBIN: 15.8 g/dL (ref 13.0–17.0)
Lymphocytes Relative: 20.1 % (ref 12.0–46.0)
Lymphs Abs: 1.1 10*3/uL (ref 0.7–4.0)
MCHC: 33.1 g/dL (ref 30.0–36.0)
MCV: 88.2 fl (ref 78.0–100.0)
MONO ABS: 0.6 10*3/uL (ref 0.1–1.0)
Monocytes Relative: 11 % (ref 3.0–12.0)
NEUTROS ABS: 3.5 10*3/uL (ref 1.4–7.7)
Neutrophils Relative %: 65.5 % (ref 43.0–77.0)
Platelets: 204 10*3/uL (ref 150.0–400.0)
RBC: 5.42 Mil/uL (ref 4.22–5.81)
RDW: 13.6 % (ref 11.5–15.5)
WBC: 5.4 10*3/uL (ref 4.0–10.5)

## 2014-03-09 NOTE — Progress Notes (Signed)
   Subjective:    Patient ID: Jose Stevens, male    DOB: 1956-02-02, 58 y.o.   MRN: [id number]  HPI  He is here to assess active health issues & conditions. PMH, FH, & Social history verified & updated   He is compliant with his medications without adverse effects. CVE 1-1.5 hours at least 5 days of the week without any symptoms. He is on a heart healthy diet. This been no change in the dose or brand of his medications.  Reflux symptoms are controlled on omeprazole.    Review of Systems Unexplained weight loss, abdominal pain, significant dyspepsia, dysphagia, melena, rectal bleeding, or persistently small caliber stools are denied.  Chest pain, palpitations, tachycardia, exertional dyspnea, paroxysmal nocturnal dyspnea, claudication or edema are absent.  He denies blurred vision, double vision, loss of vision. He has no heat or cold intolerance No change in his hair, skin, nails. No constipation or diarrhea reported  He denies myalgias or memory loss on a statin.       Objective:   Physical Exam  Positive or pertinent findings include: Very well muscled and toned. Head is shaven He does have a mustache. He has a small earring on the left. There is a flexion contracture left fifth digit. There is minor crepitus of knees.  General appearance :adequately nourished; in no distress. Eyes: No conjunctival inflammation or scleral icterus is present. Oral exam: Dental hygiene is good. Lips and gums are healthy appearing.There is no oropharyngeal erythema or exudate noted.  Heart:  Normal rate and regular rhythm. S1 and S2 normal without gallop, murmur, click, rub or other extra sounds  Lungs:Chest clear to auscultation; no wheezes, rhonchi,rales ,or rubs present.No increased work of breathing.  Abdomen: bowel sounds normal, soft and non-tender without masses, organomegaly or hernias noted.  No guarding or rebound.  Vascular : all pulses equal ; no bruits present. Skin:Warm &  dry.  Intact without suspicious lesions or rashes ; no jaundice or tenting Lymphatic: No lymphadenopathy is noted about the head, neck, axilla GU: Dr Janice Norrie           Assessment & Plan:  See Current Assessment & Plan in Problem List under specific Diagnosis

## 2014-03-09 NOTE — Assessment & Plan Note (Signed)
Lipids, LFTs, TSH  

## 2014-03-09 NOTE — Assessment & Plan Note (Signed)
CBC

## 2014-03-09 NOTE — Assessment & Plan Note (Signed)
TSH 

## 2014-03-09 NOTE — Progress Notes (Signed)
   Subjective:    Patient ID: Jose Stevens, male    DOB: 1956-04-17, 58 y.o.   MRN: [id number]  HPI  Patient is seen today for follow-up.  No acute medical problems.   Hypothyroidism -  Reports no change in dose/manufacturer of tablets.  Reports compliance without adverse effects.  Hyperlipidemia - Compliant with Crestor.  No myalgias or memory loss noted.  He is following a heart healthy diet.  Aerobic exercise 2-3 days/week 30-40 minutes at a time.   GERD -  Symptoms well controlled with Omeprazole.     Review of Systems Denies fevers, chills, sweats, unexpected weight change  Denies CV symptoms; specifically denying chest pain, shortness of breath, LE edema, palpitations, orthopnea, PND  Denies heat/cold intolerance.  No changes in hair/skin/nails.   Denies nausea, vomiting, diarrhea, change in stool consistency.  No melena.      Objective:   Physical Exam  Vitals reviewed. Constitutional: He is oriented to person, place, and time. He appears well-developed and well-nourished. No distress.  HENT:  Head: Normocephalic and atraumatic.  Mouth/Throat: Oropharynx is clear and moist. No oropharyngeal exudate.  Eyes: Conjunctivae and EOM are normal. Pupils are equal, round, and reactive to light. Right eye exhibits no discharge. Left eye exhibits no discharge.  Neck: Normal range of motion. Neck supple. No thyromegaly present.  Cardiovascular: Normal rate and regular rhythm.   No murmur heard. Pulmonary/Chest: Effort normal and breath sounds normal. No respiratory distress. He has no wheezes. He exhibits no tenderness.  Abdominal: Soft. Bowel sounds are normal. He exhibits no distension. There is no tenderness.  Musculoskeletal: Normal range of motion. He exhibits no edema and no tenderness.  Lymphadenopathy:    He has no cervical adenopathy.  Neurological: He is alert and oriented to person, place, and time.  Skin: Skin is warm and dry. No rash noted. He is not  diaphoretic. No pallor.  Psychiatric: He has a normal mood and affect. His behavior is normal. Judgment and thought content normal.       Assessment & Plan:

## 2014-03-09 NOTE — Patient Instructions (Signed)

## 2014-03-09 NOTE — Progress Notes (Signed)
Pre visit review using our clinic review tool, if applicable. No additional management support is needed unless otherwise documented below in the visit note. 

## 2014-03-10 LAB — BASIC METABOLIC PANEL
BUN: 7 mg/dL (ref 6–23)
CHLORIDE: 102 meq/L (ref 96–112)
CO2: 29 mEq/L (ref 19–32)
Calcium: 9.1 mg/dL (ref 8.4–10.5)
Creatinine, Ser: 0.9 mg/dL (ref 0.4–1.5)
GFR: 113.36 mL/min (ref >60.00)
Glucose, Bld: 81 mg/dL (ref 70–99)
POTASSIUM: 4 meq/L (ref 3.5–5.1)
Sodium: 137 mEq/L (ref 135–145)

## 2014-03-10 LAB — LIPID PANEL
Cholesterol: 175 mg/dL (ref 0–200)
HDL: 54 mg/dL (ref >39.00)
LDL Cholesterol: 104 mg/dL — ABNORMAL HIGH (ref 0–99)
NonHDL: 121
Total CHOL/HDL Ratio: 3
Triglycerides: 86 mg/dL (ref 0.0–149.0)
VLDL: 17.2 mg/dL (ref 0.0–40.0)

## 2014-03-10 LAB — HEPATIC FUNCTION PANEL
ALK PHOS: 51 U/L (ref 39–117)
ALT: 24 U/L (ref 0–53)
AST: 31 U/L (ref 0–37)
Albumin: 3.9 g/dL (ref 3.5–5.2)
BILIRUBIN DIRECT: 0.2 mg/dL (ref 0.0–0.3)
BILIRUBIN TOTAL: 1.8 mg/dL — AB (ref 0.2–1.2)
Total Protein: 7.4 g/dL (ref 6.0–8.3)

## 2014-03-10 LAB — TSH: TSH: 1.33 u[IU]/mL (ref 0.35–4.50)

## 2014-03-14 ENCOUNTER — Other Ambulatory Visit: Payer: Self-pay | Admitting: Internal Medicine

## 2014-05-11 ENCOUNTER — Ambulatory Visit (INDEPENDENT_AMBULATORY_CARE_PROVIDER_SITE_OTHER): Payer: PRIVATE HEALTH INSURANCE | Admitting: Internal Medicine

## 2014-05-11 ENCOUNTER — Encounter: Payer: Self-pay | Admitting: Internal Medicine

## 2014-05-11 VITALS — BP 150/90 | HR 75 | Temp 98.1°F | Ht 68.0 in | Wt 190.4 lb

## 2014-05-11 DIAGNOSIS — R0789 Other chest pain: Secondary | ICD-10-CM

## 2014-05-11 DIAGNOSIS — R9431 Abnormal electrocardiogram [ECG] [EKG]: Secondary | ICD-10-CM

## 2014-05-11 DIAGNOSIS — E785 Hyperlipidemia, unspecified: Secondary | ICD-10-CM

## 2014-05-11 MED ORDER — RANITIDINE HCL 150 MG PO TABS: 150.0000 mg | ORAL_TABLET | Freq: Two times a day (BID) | ORAL | Status: DC

## 2014-05-11 NOTE — Patient Instructions (Addendum)
Reflux of gastric acid may be asymptomatic as this may occur mainly during sleep.The triggers for reflux  include stress; the "aspirin family" ; alcohol; peppermint; and caffeine (coffee, tea, cola, and chocolate). The aspirin family would include aspirin and the nonsteroidal agents such as ibuprofen &  Naproxen. Tylenol would not cause reflux. If having symptoms ; food & drink should be avoided for @ least 2 hours before going to bed.   Take the Omeprazole 30 minutes before breakfast and evening meal for 2 weeks & then resume once daily in am   The Stress Test  referral will be scheduled and you'll be notified of the time.Please call the Referral Co-Ordinator @ [phone] if you have not been notified of appointment time within 7-10 days.

## 2014-05-11 NOTE — Progress Notes (Signed)
   Subjective:    Patient ID: Jose Stevens, male    DOB: 06/03/1955, 58 y.o.   MRN: [id number]  HPI Monday 05/09/14 he experienced substernal chest pressure as a "indigestion" symptom which lasted seconds. This occurred after he had worked out of the gym for 40 minutes. 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 He got home approximately 10:30 and drank a beer, ate fast food from [employer] and drank tea. After going to bed and lying in the right lateral decubitus position he felt the sudden sharp pain again. He sat up and the pain resolved  There's been no associated nausea, diaphoresis, or radiation of the symptoms  He is under great deal of stress. He works 7 days a week. 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 He is under financial strain covering  Home expenses totally after his divorce.  He will workout 5-6 days a week for 40-90 minutes without cardio pulmonic symptoms.  Positive FH for CAD. Lipids essentially @ goal in 10/15 on Crestor.   Review of Systems He denies hoarseness, dysphagia, abdominal pain, unexplained weight loss, melena, rectal bleeding, cough, sputum production, hemoptysis, radicular back pain, ankle edema, claudication, or near syncope.     Objective:   Physical Exam Pertinent positive findings include: Head is shaven.  Heart rate is slow; rhythm is regular.  General appearance :Well muscled;adequately nourished; in no distress. Eyes: No conjunctival inflammation or scleral icterus is present. Oral exam: Dental hygiene is good. Lips and gums are healthy appearing.There is no oropharyngeal erythema or exudate noted.  Heart: S1 and S2 normal without gallop, murmur, click, rub or other extra sounds   Lungs:Chest clear to auscultation; no wheezes, rhonchi,rales ,or rubs present.No increased work of  breathing.  Abdomen: bowel sounds normal, soft and non-tender without masses, organomegaly or hernias noted.  No guarding or rebound.  Vascular : all pulses equal ; no bruits present. Skin:Warm & dry.  Intact without suspicious lesions or rashes ; no tenting Lymphatic: No lymphadenopathy is noted about the head, neck, axilla         Assessment & Plan:  #1 atypical chest pain; reflux versus hiatal hernia suggested #2 poor precordial R wave progression, new vs 2014. No ischemic St-T changes  Plan: Antireflux measures and trial of bid Omeprazole. Stress test

## 2014-05-11 NOTE — Progress Notes (Signed)
Pre visit review using our clinic review tool, if applicable. No additional management support is needed unless otherwise documented below in the visit note. 

## 2014-06-15 ENCOUNTER — Other Ambulatory Visit: Payer: Self-pay | Admitting: Internal Medicine

## 2014-06-22 ENCOUNTER — Telehealth (HOSPITAL_COMMUNITY): Payer: Self-pay

## 2014-06-22 NOTE — Telephone Encounter (Signed)
Encounter complete. 

## 2014-06-23 ENCOUNTER — Telehealth (HOSPITAL_COMMUNITY): Payer: Self-pay

## 2014-06-23 NOTE — Telephone Encounter (Signed)
Encounter complete. 

## 2014-06-24 ENCOUNTER — Ambulatory Visit (HOSPITAL_COMMUNITY)
Admission: RE | Admit: 2014-06-24 | Discharge: 2014-06-24 | Disposition: A | Discharge status: Home / Self Care | Payer: PRIVATE HEALTH INSURANCE | Source: Ambulatory Visit | Attending: Internal Medicine | Admitting: Internal Medicine

## 2014-06-24 ENCOUNTER — Telehealth: Payer: Self-pay | Admitting: Internal Medicine

## 2014-06-24 DIAGNOSIS — R9431 Abnormal electrocardiogram [ECG] [EKG]: Secondary | ICD-10-CM

## 2014-06-24 DIAGNOSIS — R0789 Other chest pain: Secondary | ICD-10-CM

## 2014-06-24 NOTE — Telephone Encounter (Signed)
Pt called stated that he was schedule for a stress test today but unble to do it due to high BP. Pt schedule with Dr. Linna Darner on 06/27/14.

## 2014-06-27 ENCOUNTER — Encounter: Payer: Self-pay | Admitting: Internal Medicine

## 2014-06-27 ENCOUNTER — Other Ambulatory Visit (INDEPENDENT_AMBULATORY_CARE_PROVIDER_SITE_OTHER): Payer: PRIVATE HEALTH INSURANCE

## 2014-06-27 ENCOUNTER — Ambulatory Visit (INDEPENDENT_AMBULATORY_CARE_PROVIDER_SITE_OTHER): Payer: PRIVATE HEALTH INSURANCE | Admitting: Internal Medicine

## 2014-06-27 VITALS — BP 160/100 | HR 74 | Temp 98.0°F | Ht 68.0 in | Wt 192.5 lb

## 2014-06-27 DIAGNOSIS — I10 Essential (primary) hypertension: Secondary | ICD-10-CM | POA: Insufficient documentation

## 2014-06-27 DIAGNOSIS — E785 Hyperlipidemia, unspecified: Secondary | ICD-10-CM

## 2014-06-27 LAB — BASIC METABOLIC PANEL
BUN: 10 mg/dL (ref 6–23)
CO2: 28 mEq/L (ref 19–32)
Calcium: 9.3 mg/dL (ref 8.4–10.5)
Chloride: 103 mEq/L (ref 96–112)
Creatinine, Ser: 0.85 mg/dL (ref 0.40–1.50)
GFR: 117.88 mL/min (ref >60.00)
GLUCOSE: 92 mg/dL (ref 70–99)
POTASSIUM: 4.1 meq/L (ref 3.5–5.1)
SODIUM: 139 meq/L (ref 135–145)

## 2014-06-27 MED ORDER — LOSARTAN POTASSIUM-HCTZ 100-25 MG PO TABS: 1.0000 | ORAL_TABLET | Freq: Every day | ORAL | Status: DC

## 2014-06-27 NOTE — Progress Notes (Signed)
Pre visit review using our clinic review tool, if applicable. No additional management support is needed unless otherwise documented below in the visit note. 

## 2014-06-27 NOTE — Progress Notes (Signed)
   Subjective:    Patient ID: Jose Stevens, male    DOB: 1956-01-02, 59 y.o.   MRN: [id number]  HPI  He was unable to complete his stress test because of hypertension.  He is essentially asymptomatic except some intermittent lightheadedness.  He is under great deal of stress working 3 jobs.He & his wife divorced last year.  He continues to work out @ a high level w/o symptoms  2 brothers have had hypertension and heart attack. Neither had a heart attack before the age 35. MGF MI @ 55';M uncle MI @ 59There is no family history of CVA   Review of Systems   Chest pain, palpitations, tachycardia, exertional dyspnea, paroxysmal nocturnal dyspnea, claudication or edema are absent.  He stopped Crestor concerned it was causing muscular pain.       Objective:   Physical Exam Appears healthy and well-nourished & in no acute distress  No carotid bruits are present.No neck vein distention present at 10 - 15 degrees. Thyroid normal to palpation  Heart rhythm and rate are normal with no gallop or murmur  Chest is clear with no increased work of breathing  There is no evidence of aortic aneurysm or renal artery bruits  Abdomen soft with no organomegaly or masses. No HJR  No clubbing, cyanosis or edema present.  Pedal pulses are intact   No ischemic skin changes are present . Fingernails healthy   Alert and oriented. Strength, tone, DTRs reflexes normal         Assessment & Plan:  #1 HTN Blood pressure goals reviewed. See orders

## 2014-06-27 NOTE — Assessment & Plan Note (Signed)
ARB/ HCT  Blood pressure goals reviewed. BMET

## 2014-06-27 NOTE — Patient Instructions (Addendum)
Your next office appointment will be determined based upon review of your pending labs . Those instructions will be transmitted to you through My Chart   Critical values will be called Followup as needed for your acute issue. Please report any significant change in your symptoms.  Minimal Blood Pressure Goal= AVERAGE < 140/90;  Ideal is an AVERAGE < 135/85. This AVERAGE should be calculated from @ least 5-7 BP readings taken @ different times of day on different days of week. You should not respond to isolated BP readings , but rather the AVERAGE for that week .Please bring your  blood pressure cuff to office visits to verify that it is reliable.It  can also be checked against the blood pressure device at the pharmacy. Finger or wrist cuffs are not dependable; an arm cuff is.

## 2014-06-28 ENCOUNTER — Telehealth: Payer: Self-pay

## 2014-06-28 ENCOUNTER — Other Ambulatory Visit: Payer: Self-pay | Admitting: Internal Medicine

## 2014-06-28 DIAGNOSIS — IMO0001 Reserved for inherently not codable concepts without codable children: Secondary | ICD-10-CM

## 2014-06-28 DIAGNOSIS — E785 Hyperlipidemia, unspecified: Secondary | ICD-10-CM

## 2014-06-28 NOTE — Telephone Encounter (Signed)
-----   Message from Hendricks Limes, MD sent at 06/28/2014  5:46 AM EST ----- Come in for a fasting cholesterol panel after being off Crestor for 6 weeks- No food except water for 8 hours before.

## 2014-06-28 NOTE — Telephone Encounter (Signed)
Patient has been advised

## 2014-07-11 ENCOUNTER — Encounter: Payer: Self-pay | Admitting: Internal Medicine

## 2014-08-15 ENCOUNTER — Encounter: Payer: Self-pay | Admitting: Internal Medicine

## 2014-08-15 ENCOUNTER — Ambulatory Visit (INDEPENDENT_AMBULATORY_CARE_PROVIDER_SITE_OTHER): Payer: PRIVATE HEALTH INSURANCE | Admitting: Internal Medicine

## 2014-08-15 VITALS — BP 120/86 | HR 83 | Temp 97.7°F | Ht 68.0 in | Wt 196.5 lb

## 2014-08-15 DIAGNOSIS — E785 Hyperlipidemia, unspecified: Secondary | ICD-10-CM

## 2014-08-15 DIAGNOSIS — M609 Myositis, unspecified: Secondary | ICD-10-CM | POA: Diagnosis not present

## 2014-08-15 DIAGNOSIS — M791 Myalgia: Secondary | ICD-10-CM

## 2014-08-15 DIAGNOSIS — IMO0001 Reserved for inherently not codable concepts without codable children: Secondary | ICD-10-CM

## 2014-08-15 NOTE — Progress Notes (Signed)
Pre visit review using our clinic review tool, if applicable. No additional management support is needed unless otherwise documented below in the visit note. 

## 2014-08-15 NOTE — Patient Instructions (Signed)
Come in for a fasting cholesterol next Monday am. No food or liquids except water for 8 hours before.    Please take enteric-coated aspirin 81 mg daily with breakfast.

## 2014-08-15 NOTE — Assessment & Plan Note (Signed)
NMR Lipoprofile, CK in 1 week

## 2014-08-15 NOTE — Progress Notes (Signed)
   Subjective:    Patient ID: Jose Stevens, male    DOB: [birth year], 59 y.o.   MRN: [id number]  HPI He stopped the Crestor 20 mg in January this year as he thought it was causing muscular discomfort particularly in the gluteus maximus area.  He does follow a heart healthy diet. He exercises 4-6 days per week up to 90 minutes ;40% as cardiovascular and 60% as weights.  Maternal grandfather had heart attack at 2; maternal uncle @ 37; 2 brothers had heart attacks over 64 and one brother had 5 vessel bypass at 75.  Advanced lipid testing in 2007 revealed LDL of 188 with 2618 total particles and 2018 small dense particles. His LDL goal would be less than 100, ideally less than 70.    Review of Systems   Chest pain, palpitations, tachycardia, exertional dyspnea, paroxysmal nocturnal dyspnea, claudication or edema are absent.       Objective:   Physical Exam  Appears healthy and well-nourished & in no acute distress  No carotid bruits are present.No neck vein distention present at 10 - 15 degrees. Thyroid normal to palpation  Heart rhythm and rate are normal with no gallop or murmur  Chest is clear with no increased work of breathing  There is no evidence of aortic aneurysm or renal artery bruits  Abdomen soft with no organomegaly or masses. No HJR  No clubbing, cyanosis or edema present.  Pedal pulses are intact   No ischemic skin changes are present . Fingernails/ toenails healthy   Alert and oriented. Strength, tone, DTRs reflexes normal        Assessment & Plan:  See Current Assessment & Plan in Problem List under specific Diagnosis

## 2014-08-22 ENCOUNTER — Other Ambulatory Visit: Payer: Self-pay | Admitting: Internal Medicine

## 2014-08-22 ENCOUNTER — Other Ambulatory Visit (INDEPENDENT_AMBULATORY_CARE_PROVIDER_SITE_OTHER): Payer: PRIVATE HEALTH INSURANCE

## 2014-08-22 DIAGNOSIS — M609 Myositis, unspecified: Secondary | ICD-10-CM

## 2014-08-22 DIAGNOSIS — M791 Myalgia: Secondary | ICD-10-CM

## 2014-08-22 DIAGNOSIS — E785 Hyperlipidemia, unspecified: Secondary | ICD-10-CM

## 2014-08-22 DIAGNOSIS — IMO0001 Reserved for inherently not codable concepts without codable children: Secondary | ICD-10-CM

## 2014-08-22 LAB — CK: CK TOTAL: 235 U/L — AB (ref 7–232)

## 2014-08-23 ENCOUNTER — Encounter: Payer: Self-pay | Admitting: Internal Medicine

## 2014-08-24 LAB — NMR LIPOPROFILE WITH LIPIDS
Cholesterol, Total: 238 mg/dL — ABNORMAL HIGH (ref 100–199)
HDL PARTICLE NUMBER: 33.2 umol/L (ref >=30.5)
HDL Size: 8.1 nm — ABNORMAL LOW (ref >=9.2)
HDL-C: 53 mg/dL (ref >39)
LDL CALC: 156 mg/dL — AB (ref 0–99)
LDL Particle Number: 2216 nmol/L — ABNORMAL HIGH (ref <1000)
LDL Size: 20.6 nm (ref >=20.8)
LP-IR SCORE: 62 — AB (ref <=45)
Large HDL-P: <1.3 umol/L — ABNORMAL LOW (ref >=4.8)
Large VLDL-P: 1.9 nmol/L (ref <=2.7)
SMALL LDL PARTICLE NUMBER: 1297 nmol/L — AB (ref <=527)
Triglycerides: 143 mg/dL (ref 0–149)
VLDL Size: 41.3 nm (ref <=46.6)

## 2014-08-26 ENCOUNTER — Other Ambulatory Visit: Payer: Self-pay | Admitting: Internal Medicine

## 2014-08-26 ENCOUNTER — Telehealth: Payer: Self-pay | Admitting: Internal Medicine

## 2014-08-26 ENCOUNTER — Other Ambulatory Visit: Payer: Self-pay

## 2014-08-26 DIAGNOSIS — E785 Hyperlipidemia, unspecified: Secondary | ICD-10-CM

## 2014-08-26 MED ORDER — ATORVASTATIN CALCIUM 20 MG PO TABS: ORAL_TABLET | ORAL | Status: DC

## 2014-08-26 MED ORDER — ATORVASTATIN CALCIUM 20 MG PO TABS
ORAL_TABLET | ORAL | Status: DC
Start: 2014-08-26 — End: 2015-03-07

## 2014-08-26 NOTE — Telephone Encounter (Signed)
Patient was advised to continue taking Crestor and pharmacy is still waiting on [place]. He is wondering if we would have any samples he have. Pharmacy is [facility]. Please call patient

## 2014-08-26 NOTE — Telephone Encounter (Signed)
Pt advised that new med, atorvastatin, which should be cheaper has been called in per dr hoppers request

## 2014-09-06 ENCOUNTER — Other Ambulatory Visit: Payer: Self-pay | Admitting: Internal Medicine

## 2014-10-07 ENCOUNTER — Other Ambulatory Visit: Payer: Self-pay | Admitting: Internal Medicine

## 2014-10-07 NOTE — Telephone Encounter (Signed)
rx for bp med sent

## 2014-11-16 ENCOUNTER — Encounter: Payer: Self-pay | Admitting: Internal Medicine

## 2014-11-16 ENCOUNTER — Other Ambulatory Visit (INDEPENDENT_AMBULATORY_CARE_PROVIDER_SITE_OTHER): Payer: PRIVATE HEALTH INSURANCE

## 2014-11-16 ENCOUNTER — Telehealth: Payer: Self-pay | Admitting: Internal Medicine

## 2014-11-16 ENCOUNTER — Ambulatory Visit (INDEPENDENT_AMBULATORY_CARE_PROVIDER_SITE_OTHER): Payer: PRIVATE HEALTH INSURANCE | Admitting: Internal Medicine

## 2014-11-16 VITALS — BP 138/90 | HR 86 | Temp 98.0°F | Resp 16 | Wt 187.0 lb

## 2014-11-16 DIAGNOSIS — K5730 Diverticulosis of large intestine without perforation or abscess without bleeding: Secondary | ICD-10-CM | POA: Diagnosis not present

## 2014-11-16 DIAGNOSIS — K625 Hemorrhage of anus and rectum: Secondary | ICD-10-CM | POA: Diagnosis not present

## 2014-11-16 LAB — CBC WITH DIFFERENTIAL/PLATELET
BASOS ABS: 0 10*3/uL (ref 0.0–0.1)
Basophils Relative: 0.4 % (ref 0.0–3.0)
EOS ABS: 0.2 10*3/uL (ref 0.0–0.7)
Eosinophils Relative: 3.7 % (ref 0.0–5.0)
HEMATOCRIT: 45.6 % (ref 39.0–52.0)
Hemoglobin: 15.5 g/dL (ref 13.0–17.0)
LYMPHS ABS: 1 10*3/uL (ref 0.7–4.0)
Lymphocytes Relative: 17.8 % (ref 12.0–46.0)
MCHC: 34 g/dL (ref 30.0–36.0)
MCV: 87.4 fl (ref 78.0–100.0)
MONO ABS: 0.4 10*3/uL (ref 0.1–1.0)
Monocytes Relative: 6.5 % (ref 3.0–12.0)
Neutro Abs: 4.1 10*3/uL (ref 1.4–7.7)
Neutrophils Relative %: 71.6 % (ref 43.0–77.0)
Platelets: 236 10*3/uL (ref 150.0–400.0)
RBC: 5.22 Mil/uL (ref 4.22–5.81)
RDW: 13.6 % (ref 11.5–15.5)
WBC: 5.8 10*3/uL (ref 4.0–10.5)

## 2014-11-16 NOTE — Progress Notes (Signed)
Pre visit review using our clinic review tool, if applicable. No additional management support is needed unless otherwise documented below in the visit note. 

## 2014-11-16 NOTE — Patient Instructions (Signed)
The GI referral will be scheduled and you'll be notified of the time.Please call the Referral Co-Ordinator @ [phone] if you have not been notified of appointment time within 7-10 days.

## 2014-11-16 NOTE — Telephone Encounter (Signed)
[facility] [place] [facility] Patient Name: Jose Stevens DOB: Oct 18, 1955 Initial Comment Caller states he has blood in urine and sometimes burn. Nurse Assessment Nurse: Martyn Ehrich RN, Felicia Date/Time (Eastern Time): 11/16/2014 4:03:33 PM Confirm and document reason for call. If symptomatic, describe symptoms. ---Pt has blood in the stool for 4-5 d. No diarrhea and no fever. Also poison ivy on arms and a little on hands and chest. Weepy red rash Has the patient traveled out of the country within the last 30 days? ---No Does the patient require triage? ---Yes Related visit to physician within the last 2 weeks? ---No Does the PT have any chronic conditions? (i.e. diabetes, asthma, etc.) ---No Guidelines Guideline Title Affirmed Question Affirmed Notes Rectal Bleeding Toilet water turned red (Exceptions: turned a little pink, few drops of blood) Final Disposition User See Physician within 4 Hours (or PCP triage) Martyn Ehrich, RN, [place] Call Id: [id number]

## 2014-11-16 NOTE — Progress Notes (Signed)
   Subjective:    Patient ID: Jose Stevens, male    DOB: 1955/11/26, 59 y.o.   MRN: [id number]  HPI  He has had rectal bleeding for the last 4-5 days following bowel movements. The blood is bright red &  noted in the toilet water and on the tissue. He has some burning but no pain with the bleeding. Stools have not been bulky or hard.  Colonoscopy was performed in 6/12; diverticulosis and hyperplastic polyp were  found. Follow-up was recommended in 2022. There is no family history of colon cancer.  Review of Systems  Epistaxis, hemoptysis, hematuria, or melena denied. No unexplained weight loss, significant dyspepsia,dysphagia, or abdominal pain.  There is no abnormal bruising , bleeding, or difficulty stopping bleeding with injury.     Objective:   Physical Exam  General appearance is one of good health and nourishment w/o distress.  Eyes: No conjunctival inflammation or scleral icterus is present.  Oral exam: Dental hygiene is good; lips and gums are healthy appearing.There is no oropharyngeal erythema or exudate noted.   Heart:  Normal rate and regular rhythm. S1 and S2 normal without gallop, murmur, click, rub or other extra sounds     Lungs:Chest clear to auscultation; no wheezes, rhonchi,rales ,or rubs present.No increased work of breathing.   Abdomen: bowel sounds normal, soft and non-tender without masses, organomegaly or hernias noted.  No guarding or rebound .   Musculoskeletal: Able to lie flat and sit up without help. Negative straight leg raising bilaterally. Gait normal  Skin:Warm & dry.  Intact without suspicious lesions or rashes ; no jaundice   Lymphatic: No lymphadenopathy is noted about the head, neck, axilla  External hemorrhoid tags present. Stool is slightly liquid and light brown and Hemoccult-negative              Assessment & Plan:  #1 rectal bleeding  #2 history of diverticulosis and hyperplastic polyp  Plan: See orders and  recommendations

## 2014-11-22 ENCOUNTER — Encounter: Payer: Self-pay | Admitting: Internal Medicine

## 2014-12-16 ENCOUNTER — Encounter: Payer: Self-pay | Admitting: Internal Medicine

## 2015-03-07 ENCOUNTER — Other Ambulatory Visit: Payer: Self-pay | Admitting: Emergency Medicine

## 2015-03-07 DIAGNOSIS — E785 Hyperlipidemia, unspecified: Secondary | ICD-10-CM

## 2015-03-07 MED ORDER — ATORVASTATIN CALCIUM 20 MG PO TABS: ORAL_TABLET | ORAL | Status: DC

## 2015-04-19 ENCOUNTER — Ambulatory Visit: Payer: PRIVATE HEALTH INSURANCE | Admitting: Internal Medicine

## 2015-04-19 DIAGNOSIS — Z0289 Encounter for other administrative examinations: Secondary | ICD-10-CM

## 2015-06-02 MED FILL — ATORVASTATIN 20 MG TABLET: 20 | 84 days supply | Qty: 36 | Fill #0

## 2015-06-06 ENCOUNTER — Ambulatory Visit (INDEPENDENT_AMBULATORY_CARE_PROVIDER_SITE_OTHER): Payer: Self-pay | Admitting: Internal Medicine

## 2015-06-06 ENCOUNTER — Encounter: Payer: Self-pay | Admitting: Internal Medicine

## 2015-06-06 ENCOUNTER — Other Ambulatory Visit (INDEPENDENT_AMBULATORY_CARE_PROVIDER_SITE_OTHER): Payer: Self-pay

## 2015-06-06 VITALS — BP 136/90 | HR 82 | Temp 97.7°F | Resp 16 | Wt 193.0 lb

## 2015-06-06 DIAGNOSIS — Z23 Encounter for immunization: Secondary | ICD-10-CM

## 2015-06-06 DIAGNOSIS — E079 Disorder of thyroid, unspecified: Secondary | ICD-10-CM

## 2015-06-06 DIAGNOSIS — K219 Gastro-esophageal reflux disease without esophagitis: Secondary | ICD-10-CM

## 2015-06-06 DIAGNOSIS — E785 Hyperlipidemia, unspecified: Secondary | ICD-10-CM

## 2015-06-06 DIAGNOSIS — I10 Essential (primary) hypertension: Secondary | ICD-10-CM

## 2015-06-06 DIAGNOSIS — Z139 Encounter for screening, unspecified: Secondary | ICD-10-CM

## 2015-06-06 DIAGNOSIS — C61 Malignant neoplasm of prostate: Secondary | ICD-10-CM

## 2015-06-06 LAB — COMPREHENSIVE METABOLIC PANEL
ALK PHOS: 53 U/L (ref 39–117)
ALT: 32 U/L (ref 0–53)
AST: 33 U/L (ref 0–37)
Albumin: 4.6 g/dL (ref 3.5–5.2)
BUN: 11 mg/dL (ref 6–23)
CHLORIDE: 102 meq/L (ref 96–112)
CO2: 31 mEq/L (ref 19–32)
Calcium: 9.4 mg/dL (ref 8.4–10.5)
Creatinine, Ser: 0.98 mg/dL (ref 0.40–1.50)
GFR: 99.71 mL/min (ref >60.00)
Glucose, Bld: 95 mg/dL (ref 70–99)
POTASSIUM: 4.3 meq/L (ref 3.5–5.1)
SODIUM: 140 meq/L (ref 135–145)
TOTAL PROTEIN: 7.4 g/dL (ref 6.0–8.3)
Total Bilirubin: 1 mg/dL (ref 0.2–1.2)

## 2015-06-06 LAB — CBC WITH DIFFERENTIAL/PLATELET
BASOS PCT: 0.4 % (ref 0.0–3.0)
Basophils Absolute: 0 10*3/uL (ref 0.0–0.1)
EOS PCT: 1.6 % (ref 0.0–5.0)
Eosinophils Absolute: 0.1 10*3/uL (ref 0.0–0.7)
HCT: 48.9 % (ref 39.0–52.0)
HEMOGLOBIN: 16.3 g/dL (ref 13.0–17.0)
Lymphocytes Relative: 19.8 % (ref 12.0–46.0)
Lymphs Abs: 1.2 10*3/uL (ref 0.7–4.0)
MCHC: 33.2 g/dL (ref 30.0–36.0)
MCV: 88.7 fl (ref 78.0–100.0)
Monocytes Absolute: 0.5 10*3/uL (ref 0.1–1.0)
Monocytes Relative: 8.9 % (ref 3.0–12.0)
NEUTROS PCT: 69.3 % (ref 43.0–77.0)
Neutro Abs: 4.3 10*3/uL (ref 1.4–7.7)
Platelets: 232 10*3/uL (ref 150.0–400.0)
RBC: 5.51 Mil/uL (ref 4.22–5.81)
RDW: 13.7 % (ref 11.5–15.5)
WBC: 6.2 10*3/uL (ref 4.0–10.5)

## 2015-06-06 LAB — TSH: TSH: 1.39 u[IU]/mL (ref 0.35–4.50)

## 2015-06-06 LAB — LIPID PANEL
Cholesterol: 165 mg/dL (ref 0–200)
HDL: 54.7 mg/dL (ref >39.00)
LDL CALC: 93 mg/dL (ref 0–99)
NonHDL: 110.21
Total CHOL/HDL Ratio: 3
Triglycerides: 85 mg/dL (ref 0.0–149.0)
VLDL: 17 mg/dL (ref 0.0–40.0)

## 2015-06-06 LAB — HEMOGLOBIN A1C: Hgb A1c MFr Bld: 5.8 % (ref 4.6–6.5)

## 2015-06-06 NOTE — Assessment & Plan Note (Signed)
Following with urology - Dr. Janice Norrie

## 2015-06-06 NOTE — Assessment & Plan Note (Signed)
Check tsh Will titrate med if needed

## 2015-06-06 NOTE — Assessment & Plan Note (Signed)
Check lipid panel Continue lipitor - taking three times a week

## 2015-06-06 NOTE — Progress Notes (Signed)
Subjective:    Patient ID: Jose Stevens, male    DOB: [birth year], 60 y.o.   MRN: [id number]  HPI He is here to establish with a new pcp.  He is here for routine followup.    Hypertension: He is taking his medication most days, but he does forget. He has not taken it in a few days.  He is compliant with a low sodium diet.  He denies chest pain, palpitations, edema, shortness of breath and regular headaches. He is exercising regularly.  He does monitor his blood pressure at home and it is well controlled as long as he takes his medication daily.  Hyperlipidemia: He is taking his medication every other day as prescribed. He is compliant with a low fat/cholesterol diet. He is exercising regularly. He denies myalgias.     Hypothyroidism:  He is taking his medication daily.  He denies any recent changes in energy or weight that are unexplained.   GERD:  He is taking his medication daily as prescribed.  He denies any GERD symptoms and feels his GERD is well controlled.   Prostate cancer:  He has had radiation seeds implanted.  He follows with urology.   He did have an energy drink this morning, but no food.    Medications and allergies reviewed with patient and updated if appropriate.  Patient Active Problem List   Diagnosis Date Noted  . Diverticulosis of colon without hemorrhage 11/16/2014  . Essential hypertension 06/27/2014  . Thyroid disease 04/05/2011  . GERD 05/23/2010  . CERVICAL RADICULOPATHY, LEFT 05/23/2010  . PROSTATE CANCER 09/04/2007  . Hyperlipidemia 09/04/2007  . 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 aspirin 81 MG tablet Take 81 mg by mouth daily.      Marland Kitchen atorvastatin (LIPITOR) 20 MG tablet 1 pill Monday, Wednesday, Friday. Needs fasting labs 90 tablet 0  . levothyroxine (SYNTHROID, LEVOTHROID) 50 MCG tablet TAKE 1 TABLET BY MOUTH ONCE DAILY 90 tablet 3  . loratadine  (CLARITIN) 10 MG tablet Take 10 mg by mouth daily as needed.    Marland Kitchen losartan-hydrochlorothiazide (HYZAAR) 100-25 MG per tablet TAKE 1 TABLET BY MOUTH ONCE DAILY 30 tablet 2  . Multiple Vitamin (MULTI VITAMIN DAILY PO) Take by mouth.    Marland Kitchen omeprazole (PRILOSEC) 20 MG capsule TAKE 1 CAPSULE BY MOUTH ONCE DAILY 90 capsule 3  . Pseudoephedrine-Guaifenesin (MUCINEX D PO) Take by mouth.      . sildenafil (VIAGRA) 100 MG tablet Take 100 mg by mouth daily as needed.       No current facility-administered medications on file prior to visit.    Past Medical History  Diagnosis Date  . Hyperlipemia     [place] study LDL goal= <160. NMR Lipoprofile 2007: LDL 188 (2618/2018) HDL 38, TG 127. LDL goal= <100, ideally <75  . Rhinitis     seasonal  . Prostate cancer     Dr Janice Norrie  . Hemorrhoids     Past Surgical History  Procedure Laterality Date  . Prostate seed implant  2008     Dr.Nesi  . Tonsillectomy    . Colonoscopy  2012    negative; [facility]    Social History   Social History  . Marital Status: Married    Spouse Name: N/A  . Number of Children: N/A  . Years of Education: N/A   Social History Main Topics  . Smoking status: Former  Smoker    Quit date: 05/20/1984  . Smokeless tobacco: Never Used     Comment: smoked 1971-1986, up to 1 ppd  . Alcohol Use: 2.4 oz/week    4 Glasses of wine per week     Comment: socially  . Drug Use: No  . Sexual Activity: Not on file   Other Topics Concern  . Not on file   Social History Narrative    Family History  Problem Relation Age of Onset  . Other Father     Suicide  . Lung cancer Mother     smoker  . Prostate cancer Brother   . Heart attack Maternal Uncle 63  . Heart attack Maternal Grandfather 56  . Colon cancer Neg Hx   . Stroke Neg Hx   . Diabetes Neg Hx   . Hyperlipidemia Brother     dyslipidemia  . Heart disease Brother     5 vessel CABG  . Heart attack Brother 49    Review of Systems  Constitutional: Negative for  fever and chills.  Respiratory: Negative for cough, shortness of breath and wheezing.   Cardiovascular: Negative for chest pain, palpitations and leg swelling.  Musculoskeletal: Negative for myalgias.  Neurological: Negative for dizziness, light-headedness and headaches.       Objective:   Filed Vitals:   06/06/15 1030  BP: 136/90  Pulse: 82  Temp: 97.7 F (36.5 C)  Resp: 16   Filed Weights   06/06/15 1030  Weight: 193 lb (87.544 kg)   Body mass index is 29.35 kg/(m^2).   Physical Exam Constitutional: Appears well-developed and well-nourished. No distress.  Neck: Neck supple. No tracheal deviation present. No thyromegaly present.  No carotid bruit. No cervical adenopathy.   Cardiovascular: Normal rate, regular rhythm and normal heart sounds.   No murmur heard.  No edema Pulmonary/Chest: Effort normal and breath sounds normal. No respiratory distress. No wheezes.      Assessment & Plan:   See Problem List for Assessment and Plan of chronic medical problems.

## 2015-06-06 NOTE — Assessment & Plan Note (Signed)
gerd controlled Asymptomatic Continue daily medication for now

## 2015-06-06 NOTE — Patient Instructions (Addendum)
  We have reviewed your prior records including labs and tests today.  Test(s) ordered today. Your results will be released to [facility] (or called to you) after review, usually within 72hours after test completion. If any changes need to be made, you will be notified at that same time.  All other Health Maintenance issues reviewed.   All recommended immunizations and age-appropriate screenings are up-to-date.  Flu vacine administered today.   Medications reviewed and updated.  No changes recommended at this time.  Your prescription(s) have been submitted to your pharmacy. Please take as directed and contact our office if you believe you are having problem(s) with the medication(s).

## 2015-06-06 NOTE — Assessment & Plan Note (Signed)
Not compliant with medication BP typically well controlled on medication so no changes Stressed taking the medication daily

## 2015-06-07 ENCOUNTER — Other Ambulatory Visit: Payer: Self-pay | Admitting: Internal Medicine

## 2015-06-07 LAB — HIV ANTIBODY (ROUTINE TESTING W REFLEX): HIV 1&2 Ab, 4th Generation: NONREACTIVE

## 2015-06-07 LAB — HEPATITIS C ANTIBODY: HCV Ab: NEGATIVE

## 2015-06-09 MED FILL — LOSARTAN-HCTZ 100-25 MG TAB: 100-25 | 30 days supply | Qty: 30 | Fill #0

## 2015-06-11 ENCOUNTER — Encounter: Payer: Self-pay | Admitting: Internal Medicine

## 2015-07-07 MED FILL — LEVOTHYROXINE 50 MCG TABLET: 50 | 30 days supply | Qty: 30 | Fill #3

## 2015-07-07 MED FILL — OMEPRAZOLE DR 20 MG CAPSULE: 20 | 30 days supply | Qty: 30 | Fill #3

## 2015-08-09 MED FILL — OMEPRAZOLE DR 20 MG CAPSULE: 20 | 30 days supply | Qty: 30 | Fill #4

## 2015-08-09 MED FILL — LEVOTHYROXINE 50 MCG TABLET: 50 | 30 days supply | Qty: 30 | Fill #4

## 2015-09-11 ENCOUNTER — Other Ambulatory Visit: Payer: Self-pay | Admitting: Internal Medicine

## 2015-09-11 MED FILL — LEVOTHYROXINE 50 MCG TABLET: 50 | 30 days supply | Qty: 30 | Fill #0

## 2015-09-13 ENCOUNTER — Other Ambulatory Visit: Payer: Self-pay | Admitting: Internal Medicine

## 2015-09-14 MED FILL — OMEPRAZOLE DR 20 MG CAPSULE: 20 | 30 days supply | Qty: 30 | Fill #0

## 2015-11-01 MED FILL — OMEPRAZOLE DR 20 MG CAPSULE: 20 | 30 days supply | Qty: 30 | Fill #1

## 2015-11-01 MED FILL — LEVOTHYROXINE 50 MCG TABLET: 50 | 30 days supply | Qty: 30 | Fill #1

## 2015-11-03 MED FILL — ATORVASTATIN 20 MG TABLET: 20 | 84 days supply | Qty: 36 | Fill #1

## 2015-12-04 MED FILL — OMEPRAZOLE DR 20 MG CAPSULE: 20 | 30 days supply | Qty: 30 | Fill #2

## 2015-12-04 MED FILL — LEVOTHYROXINE 50 MCG TABLET: 50 | 30 days supply | Qty: 30 | Fill #2

## 2016-01-08 MED FILL — LEVOTHYROXINE 50 MCG TABLET: 50 | 30 days supply | Qty: 30 | Fill #3

## 2016-01-18 ENCOUNTER — Emergency Department (HOSPITAL_COMMUNITY): Payer: Managed Care, Other (non HMO)

## 2016-01-18 ENCOUNTER — Emergency Department (HOSPITAL_COMMUNITY)
Admission: EM | Admit: 2016-01-18 | Discharge: 2016-01-18 | Disposition: A | Discharge status: Home / Self Care | Payer: Managed Care, Other (non HMO) | Attending: Emergency Medicine | Admitting: Emergency Medicine

## 2016-01-18 ENCOUNTER — Encounter (HOSPITAL_COMMUNITY): Payer: Self-pay | Admitting: Emergency Medicine

## 2016-01-18 DIAGNOSIS — Z7982 Long term (current) use of aspirin: Secondary | ICD-10-CM | POA: Insufficient documentation

## 2016-01-18 DIAGNOSIS — R0789 Other chest pain: Secondary | ICD-10-CM | POA: Diagnosis not present

## 2016-01-18 DIAGNOSIS — I10 Essential (primary) hypertension: Secondary | ICD-10-CM | POA: Insufficient documentation

## 2016-01-18 DIAGNOSIS — Z87891 Personal history of nicotine dependence: Secondary | ICD-10-CM | POA: Diagnosis not present

## 2016-01-18 DIAGNOSIS — Z8546 Personal history of malignant neoplasm of prostate: Secondary | ICD-10-CM | POA: Insufficient documentation

## 2016-01-18 DIAGNOSIS — Y939 Activity, unspecified: Secondary | ICD-10-CM | POA: Insufficient documentation

## 2016-01-18 DIAGNOSIS — Y92410 Unspecified street and highway as the place of occurrence of the external cause: Secondary | ICD-10-CM | POA: Diagnosis not present

## 2016-01-18 DIAGNOSIS — Y999 Unspecified external cause status: Secondary | ICD-10-CM | POA: Insufficient documentation

## 2016-01-18 LAB — BASIC METABOLIC PANEL
Anion gap: 6 (ref 5–15)
BUN: 12 mg/dL (ref 6–20)
CHLORIDE: 104 mmol/L (ref 101–111)
CO2: 27 mmol/L (ref 22–32)
CREATININE: 1.14 mg/dL (ref 0.61–1.24)
Calcium: 9 mg/dL (ref 8.9–10.3)
GFR calc non Af Amer: >60 mL/min (ref >60)
Glucose, Bld: 92 mg/dL (ref 65–99)
POTASSIUM: 4 mmol/L (ref 3.5–5.1)
Sodium: 137 mmol/L (ref 135–145)

## 2016-01-18 LAB — CBC
HEMATOCRIT: 43.7 % (ref 39.0–52.0)
Hemoglobin: 14.5 g/dL (ref 13.0–17.0)
MCH: 28.8 pg (ref 26.0–34.0)
MCHC: 33.2 g/dL (ref 30.0–36.0)
MCV: 86.9 fL (ref 78.0–100.0)
PLATELETS: 162 10*3/uL (ref 150–400)
RBC: 5.03 MIL/uL (ref 4.22–5.81)
RDW: 13.1 % (ref 11.5–15.5)
WBC: 5.1 10*3/uL (ref 4.0–10.5)

## 2016-01-18 LAB — I-STAT TROPONIN, ED: Troponin i, poc: 0 ng/mL (ref 0.00–0.08)

## 2016-01-18 NOTE — ED Notes (Signed)
Pt departed in NAD.  

## 2016-01-18 NOTE — Discharge Instructions (Signed)
Please monitor for new or worsening signs or symptoms, return to the emergency room immediately if you experience any. Please follow-up with her primary care provider for reevaluation.
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
## 2016-01-25 ENCOUNTER — Ambulatory Visit (INDEPENDENT_AMBULATORY_CARE_PROVIDER_SITE_OTHER): Payer: Managed Care, Other (non HMO) | Admitting: Nurse Practitioner

## 2016-01-25 ENCOUNTER — Encounter: Payer: Self-pay | Admitting: Nurse Practitioner

## 2016-01-25 VITALS — BP 112/78 | HR 95 | Temp 98.5°F | Wt 179.0 lb

## 2016-01-25 DIAGNOSIS — M549 Dorsalgia, unspecified: Secondary | ICD-10-CM

## 2016-01-25 DIAGNOSIS — R0789 Other chest pain: Secondary | ICD-10-CM | POA: Diagnosis not present

## 2016-01-25 DIAGNOSIS — M546 Pain in thoracic spine: Secondary | ICD-10-CM | POA: Diagnosis not present

## 2016-01-25 MED ORDER — CYCLOBENZAPRINE HCL 5 MG PO TABS: 5.0000 mg | ORAL_TABLET | Freq: Every evening | ORAL | 0 refills | Status: DC | PRN

## 2016-01-25 MED ORDER — NAPROXEN SODIUM 220 MG PO TABS: 220.0000 mg | ORAL_TABLET | Freq: Two times a day (BID) | ORAL | Status: DC

## 2016-01-25 MED FILL — CYCLOBENZAPRINE 5 MG TABLET: 5 | 7 days supply | Qty: 7 | Fill #0

## 2016-01-25 NOTE — Progress Notes (Signed)
Pre visit review using our clinic review tool, if applicable. No additional management support is needed unless otherwise documented below in the visit note. 

## 2016-01-25 NOTE — Patient Instructions (Signed)
Use of the twice a day with food for 3 days, then use as needed for pain.  Use cyclobenzaprine only at bedtime for 3 days, then as needed.  May warm compress as needed.  Avoid lifting anything over 10 pounds for 1 week.  RICE for Routine Care of Injuries Theroutine careofmanyinjuriesincludes rest, ice, compression, and elevation (RICE therapy). RICE therapy is often recommended for injuries to soft tissues, such as a muscle strain, ligament injuries, bruises, and overuse injuries. It can also be used for some bony injuries. Using RICE therapy can help to relieve pain, lessen swelling, and enable your body to heal. Rest Rest is required to allow your body to heal. This usually involves reducing your normal activities and avoiding use of the injured part of your body. Generally, you can return to your normal activities when you are comfortable and have been given permission by your health care provider. Ice Icing your injury helps to keep the swelling down, and it lessens pain. Do not apply ice directly to your skin.  Put ice in a plastic bag.  Place a towel between your skin and the bag.  Leave the ice on for 20 minutes, 2-3 times a day. Do this for as long as you are directed by your health care provider. Compression Compression means putting pressure on the injured area. Compression helps to keep swelling down, gives support, and helps with discomfort. Compression may be done with an elastic bandage. If an elastic bandage has been applied, follow these general tips:  Remove and reapply the bandage every 3-4 hours or as directed by your health care provider.  Make sure the bandage is not wrapped too tightly, because this can cut off circulation. If part of your body beyond the bandage becomes blue, numb, cold, swollen, or more painful, your bandage is most likely too tight. If this occurs, remove your bandage and reapply it more loosely.  See your health care provider if the bandage  seems to be making your problems worse rather than better. Elevation Elevation means keeping the injured area raised. This helps to lessen swelling and decrease pain. If possible, your injured area should be elevated at or above the level of your heart or the center of your chest. [place]? You should seek medical care if:  Your pain and swelling continue.  Your symptoms are getting worse rather than improving. These symptoms may indicate that further evaluation or further X-rays are needed. Sometimes, X-rays may not show a small broken bone (fracture) until a number of days later. Make a follow-up appointment with your health care provider. WHEN SHOULD I SEEK IMMEDIATE MEDICAL CARE? You should seek immediate medical care if:  You have sudden severe pain at or below the area of your injury.  You have redness or increased swelling around your injury.  You have tingling or numbness at or below the area of your injury that does not improve after you remove the elastic bandage.   This information is not intended to replace advice given to you by your health care provider. Make sure you discuss any questions you have with your health care provider.   Document Released: 08/18/2000 Document Revised: 01/25/2015 Document Reviewed: 04/13/2014 Elsevier Interactive Patient Education [employer].
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
## 2016-01-29 MED FILL — LOSARTAN-HCTZ 100-25 MG TAB: 100-25 | 30 days supply | Qty: 30 | Fill #1

## 2016-02-02 ENCOUNTER — Encounter: Payer: Self-pay | Admitting: Nurse Practitioner

## 2016-02-08 MED FILL — OMEPRAZOLE DR 20 MG CAPSULE: 20 | 30 days supply | Qty: 30 | Fill #3

## 2016-02-08 MED FILL — LEVOTHYROXINE 50 MCG TABLET: 50 | 30 days supply | Qty: 30 | Fill #4

## 2016-02-09 ENCOUNTER — Ambulatory Visit (INDEPENDENT_AMBULATORY_CARE_PROVIDER_SITE_OTHER): Payer: Managed Care, Other (non HMO) | Admitting: Internal Medicine

## 2016-02-09 ENCOUNTER — Other Ambulatory Visit (INDEPENDENT_AMBULATORY_CARE_PROVIDER_SITE_OTHER): Payer: Managed Care, Other (non HMO)

## 2016-02-09 ENCOUNTER — Telehealth: Payer: Self-pay | Admitting: Internal Medicine

## 2016-02-09 ENCOUNTER — Ambulatory Visit: Payer: Managed Care, Other (non HMO) | Admitting: Internal Medicine

## 2016-02-09 ENCOUNTER — Encounter: Payer: Self-pay | Admitting: Internal Medicine

## 2016-02-09 VITALS — BP 122/78 | HR 85 | Temp 98.4°F | Ht 68.0 in | Wt 182.0 lb

## 2016-02-09 DIAGNOSIS — R071 Chest pain on breathing: Secondary | ICD-10-CM

## 2016-02-09 DIAGNOSIS — C61 Malignant neoplasm of prostate: Secondary | ICD-10-CM

## 2016-02-09 DIAGNOSIS — Z23 Encounter for immunization: Secondary | ICD-10-CM | POA: Diagnosis not present

## 2016-02-09 DIAGNOSIS — R079 Chest pain, unspecified: Secondary | ICD-10-CM | POA: Insufficient documentation

## 2016-02-09 DIAGNOSIS — E785 Hyperlipidemia, unspecified: Secondary | ICD-10-CM | POA: Diagnosis not present

## 2016-02-09 DIAGNOSIS — I10 Essential (primary) hypertension: Secondary | ICD-10-CM | POA: Diagnosis not present

## 2016-02-09 DIAGNOSIS — E079 Disorder of thyroid, unspecified: Secondary | ICD-10-CM

## 2016-02-09 LAB — CBC WITH DIFFERENTIAL/PLATELET
BASOS ABS: 0 10*3/uL (ref 0.0–0.1)
Basophils Relative: 0.7 % (ref 0.0–3.0)
EOS PCT: 2.2 % (ref 0.0–5.0)
Eosinophils Absolute: 0.1 10*3/uL (ref 0.0–0.7)
HCT: 44.3 % (ref 39.0–52.0)
HEMOGLOBIN: 15.3 g/dL (ref 13.0–17.0)
LYMPHS ABS: 1.3 10*3/uL (ref 0.7–4.0)
Lymphocytes Relative: 21.1 % (ref 12.0–46.0)
MCHC: 34.4 g/dL (ref 30.0–36.0)
MCV: 86.1 fl (ref 78.0–100.0)
MONO ABS: 0.6 10*3/uL (ref 0.1–1.0)
MONOS PCT: 9.8 % (ref 3.0–12.0)
NEUTROS PCT: 66.2 % (ref 43.0–77.0)
Neutro Abs: 4.2 10*3/uL (ref 1.4–7.7)
Platelets: 198 10*3/uL (ref 150.0–400.0)
RBC: 5.14 Mil/uL (ref 4.22–5.81)
RDW: 14 % (ref 11.5–15.5)
WBC: 6.4 10*3/uL (ref 4.0–10.5)

## 2016-02-09 LAB — HEPATIC FUNCTION PANEL
ALBUMIN: 4.2 g/dL (ref 3.5–5.2)
ALK PHOS: 52 U/L (ref 39–117)
ALT: 20 U/L (ref 0–53)
AST: 26 U/L (ref 0–37)
Bilirubin, Direct: 0.1 mg/dL (ref 0.0–0.3)
TOTAL PROTEIN: 6.8 g/dL (ref 6.0–8.3)
Total Bilirubin: 0.6 mg/dL (ref 0.2–1.2)

## 2016-02-09 LAB — BASIC METABOLIC PANEL
BUN: 19 mg/dL (ref 6–23)
CALCIUM: 8.9 mg/dL (ref 8.4–10.5)
CO2: 32 meq/L (ref 19–32)
Chloride: 102 mEq/L (ref 96–112)
Creatinine, Ser: 1 mg/dL (ref 0.40–1.50)
GFR: 97.2 mL/min (ref >60.00)
GLUCOSE: 93 mg/dL (ref 70–99)
Potassium: 4 mEq/L (ref 3.5–5.1)
Sodium: 139 mEq/L (ref 135–145)

## 2016-02-09 LAB — URINALYSIS, ROUTINE W REFLEX MICROSCOPIC
BILIRUBIN URINE: NEGATIVE
HGB URINE DIPSTICK: NEGATIVE
KETONES UR: NEGATIVE
LEUKOCYTES UA: NEGATIVE
NITRITE: NEGATIVE
Specific Gravity, Urine: 1.01 (ref 1.000–1.030)
Total Protein, Urine: NEGATIVE
URINE GLUCOSE: NEGATIVE
UROBILINOGEN UA: 0.2 (ref 0.0–1.0)
WBC, UA: NONE SEEN — AB (ref 0–?)
pH: 7 (ref 5.0–8.0)

## 2016-02-09 LAB — LIPID PANEL
CHOLESTEROL: 172 mg/dL (ref 0–200)
HDL: 50 mg/dL (ref >39.00)
LDL CALC: 95 mg/dL (ref 0–99)
NonHDL: 122.1
TRIGLYCERIDES: 138 mg/dL (ref 0.0–149.0)
Total CHOL/HDL Ratio: 3
VLDL: 27.6 mg/dL (ref 0.0–40.0)

## 2016-02-09 LAB — PSA

## 2016-02-09 LAB — TSH: TSH: 1.3 u[IU]/mL (ref 0.35–4.50)

## 2016-02-09 MED ORDER — LOSARTAN POTASSIUM 25 MG PO TABS: 25.0000 mg | ORAL_TABLET | Freq: Every day | ORAL | 3 refills | Status: DC

## 2016-02-09 NOTE — Assessment & Plan Note (Signed)
Asympt, doubt related to bony chest pain, ok for psa per pt request

## 2016-02-09 NOTE — Assessment & Plan Note (Signed)
stable overall by history and exam, recent data reviewed with pt, and pt to continue medical treatment as before,  to f/u any worsening symptoms or concerns BP Readings from Last 3 Encounters:  02/09/16 122/78  01/25/16 112/78  01/18/16 133/93

## 2016-02-09 NOTE — Progress Notes (Signed)
Pre visit review using our clinic review tool, if applicable. No additional management support is needed unless otherwise documented below in the visit note. 

## 2016-02-09 NOTE — Telephone Encounter (Signed)
Ok with me 

## 2016-02-09 NOTE — Assessment & Plan Note (Addendum)
C/w msk pain most likely, cant r/o neuritic, for alleve prn, no further imaging needed,  to f/u any worsening symptoms or concerns  Note:  Total time for pt hx, exam, review of record with pt in the room, determination of diagnoses and plan for further eval and tx is > 40 min, with over 50% spent in coordination and counseling of patient

## 2016-02-09 NOTE — Telephone Encounter (Signed)
Ok with me, but must be ok with Dr Quay Burow as well, thanks
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
## 2016-02-09 NOTE — Telephone Encounter (Signed)
Patient seen Dr. Jenny Reichmann today.  Would like to switch from [place] to [place].  Please advise.

## 2016-02-09 NOTE — Patient Instructions (Addendum)
You had the flu shot today  OK to reduce the blood pressure medication to losartan 25 mg per day only  Please continue to monitor your BP at home or the pharmacy on a regular basis, as the goal is to be < 140/90  OK to use the alleve for the right chest pain, which should be resolving in another 2-3 wks if not aggrevated  Please continue all other medications as before, and refills have been done if requested.  Please have the pharmacy call with any other refills you may need.  Please continue your efforts at being more active, low cholesterol diet, and weight control.  Please keep your appointments with your specialists as you may have planned  Please go to the LAB in the Basement (turn left off the elevator) for the tests to be done today  You will be contacted by phone if any changes need to be made immediately.  Otherwise, you will receive a letter about your results with an explanation, but please check with MyChart first.

## 2016-02-09 NOTE — Assessment & Plan Note (Signed)
stable overall by history and exam, recent data reviewed with pt, and pt to continue medical treatment as before,  to f/u any worsening symptoms or concerns Lab Results  Component Value Date   Almond 95 02/09/2016

## 2016-02-09 NOTE — Assessment & Plan Note (Signed)
stable overall by history and exam, recent data reviewed with pt, and pt to continue medical treatment as before,  to f/u any worsening symptoms or concerns Lab Results  Component Value Date   TSH 1.30 02/09/2016

## 2016-02-14 NOTE — Telephone Encounter (Signed)
Got patient scheduled to establish with Dr. Jenny Reichmann.

## 2016-02-21 ENCOUNTER — Ambulatory Visit (INDEPENDENT_AMBULATORY_CARE_PROVIDER_SITE_OTHER): Payer: Managed Care, Other (non HMO) | Admitting: Internal Medicine

## 2016-02-21 ENCOUNTER — Encounter: Payer: Self-pay | Admitting: Internal Medicine

## 2016-02-21 VITALS — BP 120/78 | HR 96 | Temp 98.2°F | Resp 20 | Wt 181.2 lb

## 2016-02-21 DIAGNOSIS — E785 Hyperlipidemia, unspecified: Secondary | ICD-10-CM | POA: Diagnosis not present

## 2016-02-21 DIAGNOSIS — E039 Hypothyroidism, unspecified: Secondary | ICD-10-CM | POA: Diagnosis not present

## 2016-02-21 DIAGNOSIS — R071 Chest pain on breathing: Secondary | ICD-10-CM

## 2016-02-21 DIAGNOSIS — I10 Essential (primary) hypertension: Secondary | ICD-10-CM

## 2016-02-21 DIAGNOSIS — Z0001 Encounter for general adult medical examination with abnormal findings: Secondary | ICD-10-CM

## 2016-02-21 DIAGNOSIS — Z0000 Encounter for general adult medical examination without abnormal findings: Secondary | ICD-10-CM | POA: Insufficient documentation

## 2016-02-21 MED FILL — LOSARTAN POTASSIUM 25 MG TA: 25 | 30 days supply | Qty: 30 | Fill #0
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
## 2016-02-21 NOTE — Patient Instructions (Signed)
Please continue all other medications as before, and refills have been done if requested.  Please have the pharmacy call with any other refills you may need.  Please continue your efforts at being more active, low cholesterol diet, and weight control.  You are otherwise up to date with prevention measures today.  Please keep your appointments with your specialists as you may have planned  Your blood work was OK today  Please return in 1 year for your yearly visit, or sooner if needed, with Lab testing done 3-5 days before

## 2016-02-21 NOTE — Progress Notes (Signed)
Pre visit review using our clinic review tool, if applicable. No additional management support is needed unless otherwise documented below in the visit note. 
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
## 2016-02-24 NOTE — Assessment & Plan Note (Signed)
stable overall by history and exam, recent data reviewed with pt, and pt to continue medical treatment as before,  to f/u any worsening symptoms or concerns BP Readings from Last 3 Encounters:  02/21/16 120/78  02/09/16 122/78  01/25/16 112/78

## 2016-02-24 NOTE — Assessment & Plan Note (Signed)

## 2016-02-24 NOTE — Assessment & Plan Note (Signed)
Lab Results  Component Value Date   Jose Stevens 95 02/09/2016   stable overall by history and exam, recent data reviewed with pt, and pt to continue medical treatment as before,  to f/u any worsening symptoms or concerns

## 2016-02-24 NOTE — Assessment & Plan Note (Signed)
stable overall by history and exam, recent data reviewed with pt, and pt to continue medical treatment as before,  to f/u any worsening symptoms or concerns Lab Results  Component Value Date   TSH 1.30 02/09/2016

## 2016-03-12 MED FILL — LEVOTHYROXINE 50 MCG TABLET: 50 | 30 days supply | Qty: 30 | Fill #5

## 2016-03-28 MED FILL — OMEPRAZOLE 20 MG CAPSULE DR: 20 | 30 days supply | Qty: 30 | Fill #4

## 2016-04-15 ENCOUNTER — Other Ambulatory Visit: Payer: Self-pay | Admitting: Internal Medicine

## 2016-04-15 MED FILL — LEVOTHYROXINE 50 MCG TABLET: 50 | 10 days supply | Qty: 30 | Fill #0

## 2016-05-06 MED FILL — LOSARTAN POTASSIUM 25 MG TA: 25 | 30 days supply | Qty: 30 | Fill #1

## 2016-05-06 MED FILL — OMEPRAZOLE 20 MG CAPSULE DR: 20 | 30 days supply | Qty: 30 | Fill #5

## 2016-05-07 ENCOUNTER — Other Ambulatory Visit: Payer: Self-pay | Admitting: Internal Medicine

## 2016-05-07 DIAGNOSIS — E785 Hyperlipidemia, unspecified: Secondary | ICD-10-CM

## 2016-05-09 ENCOUNTER — Other Ambulatory Visit: Payer: Self-pay

## 2016-05-09 MED ORDER — ATORVASTATIN CALCIUM 20 MG PO TABS: ORAL_TABLET | ORAL | 0 refills | Status: DC

## 2016-05-09 MED FILL — ATORVASTATIN 20 MG TABLET: 20 | 28 days supply | Qty: 12 | Fill #0

## 2016-05-17 MED FILL — LEVOTHYROXINE 50 MCG TABLET: 50 | 10 days supply | Qty: 30 | Fill #1

## 2016-06-12 MED FILL — ATORVASTATIN 20 MG TABLET: 20 | 28 days supply | Qty: 12 | Fill #1

## 2016-06-12 MED FILL — OMEPRAZOLE 20 MG CAPSULE DR: 20 | 30 days supply | Qty: 30 | Fill #6

## 2016-06-18 MED FILL — LEVOTHYROXINE 50 MCG TABLET: 50 | 10 days supply | Qty: 30 | Fill #2

## 2016-07-16 MED FILL — LOSARTAN POTASSIUM 25 MG TA: 25 | 30 days supply | Qty: 30 | Fill #2

## 2016-07-16 MED FILL — LEVOTHYROXINE 50 MCG TABLET: 50 | 10 days supply | Qty: 30 | Fill #3

## 2016-07-16 MED FILL — OMEPRAZOLE 20 MG CAPSULE DR: 20 | 30 days supply | Qty: 30 | Fill #7

## 2016-08-02 MED FILL — ATORVASTATIN 20 MG TABLET: 20 | 28 days supply | Qty: 12 | Fill #2

## 2016-08-13 ENCOUNTER — Ambulatory Visit (INDEPENDENT_AMBULATORY_CARE_PROVIDER_SITE_OTHER): Payer: Managed Care, Other (non HMO) | Admitting: Internal Medicine

## 2016-08-13 ENCOUNTER — Encounter: Payer: Self-pay | Admitting: Internal Medicine

## 2016-08-13 VITALS — BP 120/70 | HR 80 | Temp 97.7°F | Resp 12 | Ht 68.0 in | Wt 187.0 lb

## 2016-08-13 DIAGNOSIS — Z0001 Encounter for general adult medical examination with abnormal findings: Secondary | ICD-10-CM

## 2016-08-13 DIAGNOSIS — R21 Rash and other nonspecific skin eruption: Secondary | ICD-10-CM

## 2016-08-13 DIAGNOSIS — I10 Essential (primary) hypertension: Secondary | ICD-10-CM

## 2016-08-13 DIAGNOSIS — R071 Chest pain on breathing: Secondary | ICD-10-CM

## 2016-08-13 MED ORDER — TRIAMCINOLONE ACETONIDE 0.1 % EX CREA: 1.0000 "application " | TOPICAL_CREAM | Freq: Two times a day (BID) | CUTANEOUS | 0 refills | Status: AC

## 2016-08-13 NOTE — Patient Instructions (Addendum)
Your EKG was ok today  Please take all new medication as prescribed - the steroid cream  Please continue all other medications as before, and refills have been done if requested.  Please have the pharmacy call with any other refills you may need.  Please keep your appointments with your specialists as you may have planned  You will be contacted regarding the referral for: stress test  Please return in 6 months, or sooner if needed, with Lab testing done 3-5 days before

## 2016-08-13 NOTE — Progress Notes (Signed)
Pre visit review using our clinic review tool, if applicable. No additional management support is needed unless otherwise documented below in the visit note. 

## 2016-08-13 NOTE — Assessment & Plan Note (Signed)
stable overall by history and exam, recent data reviewed with pt, and pt to continue medical treatment as before,  to f/u any worsening symptoms or concerns BP Readings from Last 3 Encounters:  08/13/16 120/70  02/21/16 120/78  02/09/16 122/78

## 2016-08-13 NOTE — Assessment & Plan Note (Signed)
Atypical, strong family hx, ecg reviewed, no current pain, for stress test

## 2016-08-13 NOTE — Assessment & Plan Note (Signed)
Mild small itchy area 1 cm to left mid bicep c/w dermatitis, for triam cr prn

## 2016-08-13 NOTE — Progress Notes (Signed)
Subjective:    Patient ID: Jose Stevens, male    DOB: 1956-05-04, 61 y.o.   MRN: [id number]  HPI  Here with c/o SSCP dull without radiation, mild intermittent, last was last PM, non exertional, nonpleurtic and nonpostitional, not assoc with diaphoresis, n/v, palp or dizziness.  Pt denies new neurological symptoms such as new headache, or facial or extremity weakness or numbness   Pt denies polydipsia, polyuria, or low sugar symptoms such as weakness or confusion improved with po intake.  Pt states overall good compliance with meds, trying to follow lower cholesterol, diabetic diet, wt overall stable but little exercise however.   Has 2 brothers with heart disease, both near his age.  Has rash to left bicep approx 1 cm area itchy scaly, nontender, no swelling for 2 wks Past Medical History:  Diagnosis Date  . Hemorrhoids   . Hyperlipemia    [place] study LDL goal= <160. NMR Lipoprofile 2007: LDL 188 (2618/2018) HDL 38, TG 127. LDL goal= <100, ideally <75  . Prostate cancer ([place])    Dr Janice Norrie  . Rhinitis    seasonal   Past Surgical History:  Procedure Laterality Date  . COLONOSCOPY  2012   negative; [facility]  . prostate seed implant  2008    Dr.Nesi  . TONSILLECTOMY      reports that he quit smoking about 32 years ago. He has never used smokeless tobacco. He reports that he drinks about 2.4 oz of alcohol per week . He reports that he does not use drugs. family history includes Heart attack (age of onset: 13) in his maternal grandfather; Heart attack (age of onset: 2) in his brother; Heart attack (age of onset: 44) in his maternal uncle; Heart disease in his brother; Hyperlipidemia in his brother; Lung cancer in his mother; Other in his father; Prostate cancer in his brother. No Known Allergies Current Outpatient Prescriptions on File Prior to Visit  Medication Sig Dispense Refill  . aspirin 81 MG tablet Take 81 mg by mouth daily.      Marland Kitchen atorvastatin (LIPITOR) 20 MG tablet 1 pill  Monday, Wednesday, Friday. Needs fasting labs 90 tablet 0  . cyclobenzaprine (FLEXERIL) 5 MG tablet Take 1 tablet (5 mg total) by mouth at bedtime as needed for muscle spasms. 7 tablet 0  . levothyroxine (SYNTHROID, LEVOTHROID) 50 MCG tablet TAKE 1 TABLET BY MOUTH ONCE DAILY 90 tablet 1  . loratadine (CLARITIN) 10 MG tablet Take 10 mg by mouth daily as needed.    Marland Kitchen losartan (COZAAR) 25 MG tablet Take 1 tablet (25 mg total) by mouth daily. 90 tablet 3  . Multiple Vitamin (MULTI VITAMIN DAILY PO) Take by mouth.    . naproxen sodium (ALEVE) 220 MG tablet Take 1 tablet (220 mg total) by mouth 2 (two) times daily with a meal. 30 tablet   . omeprazole (PRILOSEC) 20 MG capsule TAKE 1 CAPSULE BY MOUTH ONCE DAILY 90 capsule 3  . Pseudoephedrine-Guaifenesin (MUCINEX D PO) Take by mouth.      . sildenafil (VIAGRA) 100 MG tablet Take 100 mg by mouth daily as needed.       No current facility-administered medications on file prior to visit.    Review of Systems  Constitutional: Negative for unusual diaphoresis or night sweats HENT: Negative for ear swelling or discharge Eyes: Negative for worsening visual haziness  Respiratory: Negative for choking and stridor.   Gastrointestinal: Negative for distension or worsening eructation Genitourinary: Negative for retention or change  in urine volume.  Musculoskeletal: Negative for other MSK pain or swelling Skin: Negative for color change and worsening wound Neurological: Negative for tremors and numbness other than noted  Psychiatric/Behavioral: Negative for decreased concentration or agitation other than above   All other system neg per pt    Objective:   Physical Exam BP 120/70 (BP Location: Left Arm, Patient Position: Sitting, Cuff Size: Normal)   Pulse 80   Temp 97.7 F (36.5 C) (Oral)   Resp 12   Ht 5\' 8"  (1.727 m)   Wt 187 lb (84.8 kg)   SpO2 99%   BMI 28.43 kg/m  VS noted,  Constitutional: Pt appears in no apparent distress HENT: Head:  NCAT.  Right Ear: External ear normal.  Left Ear: External ear normal.  Eyes: . Pupils are equal, round, and reactive to light. Conjunctivae and EOM are normal Neck: Normal range of motion. Neck supple.  Cardiovascular: Normal rate and regular rhythm.   Pulmonary/Chest: Effort normal and breath sounds without rales or wheezing.  Abd:  Soft, NT, ND, + BS Neurological: Pt is alert. Not confused , motor grossly intact Skin: Skin is warm. No rash, no LE edema Psychiatric: Pt behavior is normal. No agitation.  No other exam findings  ECG today I have personally interpreted Sinus  Rhythm  - WNL    Assessment & Plan:

## 2016-08-14 ENCOUNTER — Encounter: Payer: Self-pay | Admitting: Internal Medicine

## 2016-08-14 MED FILL — TRIAMCINOLONE 0.1% CREAM: 0.1 | 15 days supply | Qty: 30 | Fill #0

## 2016-08-21 MED FILL — LEVOTHYROXINE 50 MCG TABLET: 50 | 30 days supply | Qty: 30 | Fill #4

## 2016-08-23 MED FILL — OMEPRAZOLE 20 MG CAPSULE DR: 20 | 30 days supply | Qty: 30 | Fill #8

## 2016-09-16 MED FILL — LEVOTHYROXINE 50 MCG TABLET: 50 | 30 days supply | Qty: 30 | Fill #5

## 2016-09-16 MED FILL — LOSARTAN POTASSIUM 25 MG TA: 25 | 30 days supply | Qty: 30 | Fill #3

## 2016-09-16 MED FILL — ATORVASTATIN 20 MG TABLET: 20 | 28 days supply | Qty: 12 | Fill #3

## 2016-09-25 ENCOUNTER — Other Ambulatory Visit: Payer: Self-pay | Admitting: Internal Medicine

## 2016-09-26 MED FILL — OMEPRAZOLE 20 MG CAPSULE DR: 20 | 30 days supply | Qty: 30 | Fill #0

## 2016-10-24 ENCOUNTER — Other Ambulatory Visit: Payer: Self-pay | Admitting: Internal Medicine

## 2016-10-24 MED FILL — ATORVASTATIN 20 MG TABLET: 20 | 28 days supply | Qty: 12 | Fill #4

## 2016-10-24 MED FILL — LEVOTHYROXINE 50 MCG TABLET: 50 | 30 days supply | Qty: 30 | Fill #0

## 2016-11-06 MED FILL — OMEPRAZOLE 20 MG CAPSULE DR: 20 | 30 days supply | Qty: 30 | Fill #1

## 2016-11-06 MED FILL — LOSARTAN POTASSIUM 25 MG TA: 25 | 30 days supply | Qty: 30 | Fill #4

## 2016-11-25 MED FILL — LEVOTHYROXINE 50 MCG TABLET: 50 | 30 days supply | Qty: 30 | Fill #1

## 2016-12-09 MED FILL — LOSARTAN POTASSIUM 25 MG TA: 25 | 30 days supply | Qty: 30 | Fill #5

## 2016-12-09 MED FILL — OMEPRAZOLE 20 MG CAP: 20 | 30 days supply | Qty: 30 | Fill #2

## 2016-12-09 MED FILL — ATORVASTATIN 20 MG TABLET: 20 | 28 days supply | Qty: 12 | Fill #5

## 2016-12-30 MED FILL — LEVOTHYROXINE 50 MCG TABLET: 50 | 30 days supply | Qty: 30 | Fill #2

## 2017-01-15 MED FILL — ATORVASTATIN 20 MG TABLET: 20 | 28 days supply | Qty: 12 | Fill #6

## 2017-01-15 MED FILL — OMEPRAZOLE 20 MG CAP: 20 | 30 days supply | Qty: 30 | Fill #3

## 2017-01-15 MED FILL — LOSARTAN POTASSIUM 25 MG TA: 25 | 30 days supply | Qty: 30 | Fill #6

## 2017-01-17 MED FILL — AMOXICILLIN 875 MG TABLET: 875 | 5 days supply | Qty: 11 | Fill #0

## 2017-01-28 MED FILL — oxyCODONE HCL 5 MG TABS: 5 | 1 days supply | Qty: 6 | Fill #0

## 2017-02-04 MED FILL — LEVOTHYROXINE 50 MCG TABLET: 50 | 30 days supply | Qty: 30 | Fill #3

## 2017-02-24 ENCOUNTER — Other Ambulatory Visit: Payer: Self-pay | Admitting: Internal Medicine

## 2017-02-24 MED FILL — ATORVASTATIN 20 MG TABLET: 20 | 14 days supply | Qty: 6 | Fill #7

## 2017-02-24 MED FILL — OMEPRAZOLE 20 MG CAP: 20 | 30 days supply | Qty: 30 | Fill #4

## 2017-03-07 MED FILL — LEVOTHYROXINE 50 MCG TABLET: 50 | 30 days supply | Qty: 30 | Fill #4

## 2017-03-25 MED FILL — OMEPRAZOLE 20 MG CAP: 20 | 30 days supply | Qty: 30 | Fill #5

## 2017-03-25 MED FILL — ATORVASTATIN 20 MG TABLET: 20 | 28 days supply | Qty: 12 | Fill #0

## 2017-04-07 ENCOUNTER — Other Ambulatory Visit: Payer: Self-pay | Admitting: Internal Medicine

## 2017-04-07 MED FILL — LOSARTAN POTASSIUM 25 MG TA: 25 | 30 days supply | Qty: 30 | Fill #0

## 2017-04-07 MED FILL — LEVOTHYROXINE 50 MCG TABLET: 50 | 30 days supply | Qty: 30 | Fill #5

## 2017-04-11 MED FILL — AZITHROMYCIN 500 MG TABLET: 500 | 3 days supply | Qty: 3 | Fill #0

## 2017-04-25 MED FILL — OMEPRAZOLE 20 MG CAP: 20 | 30 days supply | Qty: 30 | Fill #6

## 2017-04-29 ENCOUNTER — Telehealth: Payer: Self-pay | Admitting: Internal Medicine

## 2017-04-29 NOTE — Telephone Encounter (Signed)
PLEASE NOTE: All timestamps contained within this report are represented as [place] Standard Time. CONFIDENTIALTY NOTICE: This fax transmission is intended only for the addressee. It contains information that is legally privileged, confidential or otherwise protected from use or disclosure. If you are not the intended recipient, you are strictly prohibited from reviewing, disclosing, copying using or disseminating any of this information or taking any action in reliance on or regarding this information. If you have received this fax in error, please notify us immediately by telephone so that we can arrange for its return to [place]. Phone: [phone], Toll-Free: [phone], Fax: [phone] Page: 1 of 1 Call Id: [id number] [place] [facility] - Client [place] Patient Name: Jose Stevens DOB: [birth year] Initial Comment Caller states he may have strep throat. His throat has been sore, feverish, and has body aches. Nurse Assessment Nurse: Dimas Chyle, RN, Dellis Filbert Date/Time Eilene Ghazi Time): 04/29/2017 9:22:02 AM Confirm and document reason for call. If symptomatic, describe symptoms. ---Caller states he may have strep throat. His throat has been sore, feverish, and has body aches. Symptoms for 3-4 days. Reddened throat. Does the patient have any new or worsening symptoms? ---Yes Will a triage be completed? ---Yes Related visit to physician within the last 2 weeks? ---No Does the PT have any chronic conditions? (i.e. diabetes, asthma, etc.) ---Yes List chronic conditions. ---HTN Is this a behavioral health or substance abuse call? ---No Guidelines Guideline Title Affirmed Question Affirmed Notes Sore Throat [1] Sore throat with cough/cold symptoms AND [2] present < 5 days Final Disposition User [facility] [place], RN, Guerry Minors Disagree/Comply Comply Caller Understands Yes PreDisposition Did not know what to do

## 2017-04-30 NOTE — Telephone Encounter (Signed)
Called and LVM to see if patient wanted to be seen in the office for his throat.

## 2017-05-07 ENCOUNTER — Other Ambulatory Visit: Payer: Self-pay | Admitting: Internal Medicine

## 2017-05-07 MED FILL — ATORVASTATIN 20 MG TABLET: 20 | 28 days supply | Qty: 12 | Fill #1

## 2017-05-07 MED FILL — LOSARTAN POTASSIUM 25 MG TA: 25 | 30 days supply | Qty: 30 | Fill #1

## 2017-05-07 MED FILL — LEVOTHYROXINE 50 MCG TABLET: 50 | 30 days supply | Qty: 30 | Fill #0

## 2017-05-29 MED FILL — OMEPRAZOLE 20 MG CAP: 20 | 30 days supply | Qty: 30 | Fill #7

## 2017-06-10 MED FILL — LOSARTAN POTASSIUM 25 MG TA: 25 | 30 days supply | Qty: 30 | Fill #2

## 2017-06-10 MED FILL — ATORVASTATIN 20 MG TABLET: 20 | 28 days supply | Qty: 12 | Fill #2

## 2017-06-10 MED FILL — LEVOTHYROXINE 50 MCG TABLET: 50 | 30 days supply | Qty: 30 | Fill #1

## 2017-06-16 ENCOUNTER — Ambulatory Visit: Payer: Managed Care, Other (non HMO)

## 2017-06-17 ENCOUNTER — Ambulatory Visit (INDEPENDENT_AMBULATORY_CARE_PROVIDER_SITE_OTHER): Payer: BLUE CROSS/BLUE SHIELD

## 2017-06-17 DIAGNOSIS — Z23 Encounter for immunization: Secondary | ICD-10-CM

## 2017-06-27 MED FILL — OMEPRAZOLE 20 MG CAP: 20 | 30 days supply | Qty: 30 | Fill #8

## 2017-07-07 MED FILL — LEVOTHYROXINE 50 MCG TABLET: 50 | 30 days supply | Qty: 30 | Fill #2

## 2017-07-28 MED FILL — ATORVASTATIN 20 MG TABLET: 20 | 28 days supply | Qty: 12 | Fill #3

## 2017-07-28 MED FILL — OMEPRAZOLE 20 MG CAP: 20 | 30 days supply | Qty: 30 | Fill #9

## 2017-08-06 ENCOUNTER — Ambulatory Visit: Payer: Self-pay

## 2017-08-06 MED ORDER — ROSUVASTATIN CALCIUM 20 MG PO TABS: 20.0000 mg | ORAL_TABLET | Freq: Every day | ORAL | 3 refills | Status: DC

## 2017-08-06 NOTE — Telephone Encounter (Signed)
  Reason for Disposition . Caller has NON-URGENT medication question about med that PCP prescribed and triager unable to answer question  Answer Assessment - Initial Assessment Questions 1. SYMPTOMS: "Do you have any symptoms?"     Muscle soreness on the days he takes the Lipitor. Also has 2 brothers that had to stop Lipitor for the same symptoms. Pt is asking if another med could be prescribed. The pt has an appt 08/15/17 @ 1340 with PCP. Pt would appreciate a call with what to do . 2. SEVERITY: If symptoms are present, ask "Are they mild, moderate or severe?"     Soreness is moderate  Protocols used: MEDICATION QUESTION CALL-A-AH

## 2017-08-06 NOTE — Telephone Encounter (Signed)
Patient called, left VM to call the office back to discuss his symptoms.

## 2017-08-06 NOTE — Telephone Encounter (Signed)
[place] for change to crestor 20 qd - done erx

## 2017-08-06 NOTE — Addendum Note (Signed)
Addended by: Biagio Borg on: 08/06/2017 06:09 PM   Modules accepted: Orders

## 2017-08-06 NOTE — Telephone Encounter (Signed)
Please advise 

## 2017-08-07 MED FILL — ROSUVASTATIN CALCIUM 20 MG: 20 | 90 days supply | Qty: 90 | Fill #0

## 2017-08-07 NOTE — Telephone Encounter (Signed)
Pt has been notified.

## 2017-08-11 ENCOUNTER — Other Ambulatory Visit: Payer: Self-pay | Admitting: Internal Medicine

## 2017-08-11 MED FILL — LEVOTHYROXINE 50 MCG TABLET: 50 | 90 days supply | Qty: 90 | Fill #0

## 2017-08-15 ENCOUNTER — Ambulatory Visit: Payer: Managed Care, Other (non HMO) | Admitting: Internal Medicine

## 2017-08-29 ENCOUNTER — Other Ambulatory Visit: Payer: Self-pay | Admitting: Internal Medicine

## 2017-08-29 ENCOUNTER — Other Ambulatory Visit (INDEPENDENT_AMBULATORY_CARE_PROVIDER_SITE_OTHER): Payer: Managed Care, Other (non HMO)

## 2017-08-29 ENCOUNTER — Encounter: Payer: Self-pay | Admitting: Internal Medicine

## 2017-08-29 ENCOUNTER — Ambulatory Visit (INDEPENDENT_AMBULATORY_CARE_PROVIDER_SITE_OTHER): Payer: Managed Care, Other (non HMO) | Admitting: Internal Medicine

## 2017-08-29 VITALS — BP 128/86 | HR 87 | Temp 98.6°F | Ht 68.0 in | Wt 191.0 lb

## 2017-08-29 DIAGNOSIS — I10 Essential (primary) hypertension: Secondary | ICD-10-CM

## 2017-08-29 DIAGNOSIS — Z0001 Encounter for general adult medical examination with abnormal findings: Secondary | ICD-10-CM

## 2017-08-29 DIAGNOSIS — J069 Acute upper respiratory infection, unspecified: Secondary | ICD-10-CM

## 2017-08-29 LAB — HEPATIC FUNCTION PANEL
ALK PHOS: 49 U/L (ref 39–117)
ALT: 18 U/L (ref 0–53)
AST: 18 U/L (ref 0–37)
Albumin: 4.3 g/dL (ref 3.5–5.2)
BILIRUBIN DIRECT: 0.2 mg/dL (ref 0.0–0.3)
BILIRUBIN TOTAL: 1.3 mg/dL — AB (ref 0.2–1.2)
Total Protein: 6.7 g/dL (ref 6.0–8.3)

## 2017-08-29 LAB — URINALYSIS, ROUTINE W REFLEX MICROSCOPIC
BILIRUBIN URINE: NEGATIVE
KETONES UR: NEGATIVE
Leukocytes, UA: NEGATIVE
NITRITE: NEGATIVE
Specific Gravity, Urine: 1.01 (ref 1.000–1.030)
Total Protein, Urine: NEGATIVE
Urine Glucose: NEGATIVE
Urobilinogen, UA: 0.2 (ref 0.0–1.0)
pH: 5.5 (ref 5.0–8.0)

## 2017-08-29 LAB — CBC WITH DIFFERENTIAL/PLATELET
BASOS ABS: 0 10*3/uL (ref 0.0–0.1)
Basophils Relative: 0.5 % (ref 0.0–3.0)
EOS ABS: 0.1 10*3/uL (ref 0.0–0.7)
Eosinophils Relative: 1.3 % (ref 0.0–5.0)
HCT: 45.9 % (ref 39.0–52.0)
Hemoglobin: 15.8 g/dL (ref 13.0–17.0)
LYMPHS ABS: 0.8 10*3/uL (ref 0.7–4.0)
Lymphocytes Relative: 15.6 % (ref 12.0–46.0)
MCHC: 34.4 g/dL (ref 30.0–36.0)
MCV: 88.3 fl (ref 78.0–100.0)
MONO ABS: 0.9 10*3/uL (ref 0.1–1.0)
MONOS PCT: 17.3 % — AB (ref 3.0–12.0)
NEUTROS PCT: 65.3 % (ref 43.0–77.0)
Neutro Abs: 3.4 10*3/uL (ref 1.4–7.7)
PLATELETS: 165 10*3/uL (ref 150.0–400.0)
RBC: 5.2 Mil/uL (ref 4.22–5.81)
RDW: 13.7 % (ref 11.5–15.5)
WBC: 5.2 10*3/uL (ref 4.0–10.5)

## 2017-08-29 LAB — LIPID PANEL
CHOL/HDL RATIO: 3
Cholesterol: 138 mg/dL (ref 0–200)
HDL: 48.2 mg/dL (ref >39.00)
LDL CALC: 72 mg/dL (ref 0–99)
NONHDL: 89.78
Triglycerides: 87 mg/dL (ref 0.0–149.0)
VLDL: 17.4 mg/dL (ref 0.0–40.0)

## 2017-08-29 LAB — BASIC METABOLIC PANEL
BUN: 8 mg/dL (ref 6–23)
CHLORIDE: 102 meq/L (ref 96–112)
CO2: 29 meq/L (ref 19–32)
Calcium: 8.9 mg/dL (ref 8.4–10.5)
Creatinine, Ser: 1.04 mg/dL (ref 0.40–1.50)
GFR: 92.44 mL/min (ref >60.00)
GLUCOSE: 92 mg/dL (ref 70–99)
Potassium: 4.5 mEq/L (ref 3.5–5.1)
Sodium: 139 mEq/L (ref 135–145)

## 2017-08-29 LAB — PSA: PSA: 0 ng/mL — ABNORMAL LOW (ref 0.10–4.00)

## 2017-08-29 LAB — TSH: TSH: 2.34 u[IU]/mL (ref 0.35–4.50)

## 2017-08-29 MED ORDER — LOSARTAN POTASSIUM 25 MG PO TABS: 25.0000 mg | ORAL_TABLET | Freq: Every day | ORAL | 1 refills | Status: DC

## 2017-08-29 MED ORDER — AZITHROMYCIN 250 MG PO TABS: ORAL_TABLET | ORAL | 1 refills | Status: DC

## 2017-08-29 MED ORDER — HYDROCODONE-HOMATROPINE 5-1.5 MG/5ML PO SYRP: 5.0000 mL | ORAL_SOLUTION | Freq: Four times a day (QID) | ORAL | 0 refills | Status: AC | PRN

## 2017-08-29 MED FILL — HYDROCODONE-HOMATROPINE SOL: 5-1.5 | 9 days supply | Qty: 180 | Fill #0

## 2017-08-29 MED FILL — AZITHROMYCIN 250 MG TABS: 250 | 5 days supply | Qty: 6 | Fill #0

## 2017-08-29 MED FILL — LOSARTAN POTASSIUM 25 MG TA: 25 | 90 days supply | Qty: 90 | Fill #0

## 2017-08-29 MED FILL — OMEPRAZOLE 20 MG CAP: 20 | 30 days supply | Qty: 30 | Fill #10

## 2017-08-29 NOTE — Progress Notes (Signed)
Subjective:    Patient ID: Jose Stevens, male    DOB: 10-04-1955, 62 y.o.   MRN: [id number]    HPI  Here for wellness and f/u;  Overall doing ok;  Pt denies Chest pain, worsening SOB, DOE, wheezing, orthopnea, PND, worsening LE edema, palpitations, dizziness or syncope.  Pt denies neurological change such as new headache, facial or extremity weakness.  Pt denies polydipsia, polyuria, or low sugar symptoms. Pt states overall good compliance with treatment and medications, good tolerability, and has been trying to follow appropriate diet.  Pt denies worsening depressive symptoms, suicidal ideation or panic. No fever, night sweats, wt loss, loss of appetite, or other constitutional symptoms.  Pt states good ability with ADL's, has low fall risk, home safety reviewed and adequate, no other significant changes in hearing or vision, and very active with exercise, as he is still employed as Physiological scientist. Declines Tdap. Also,  Here with 2-3 days acute onset fever, facial pain, pressure, headache, general weakness and malaise, and greenish d/c, with mild ST and cough Past Medical History:  Diagnosis Date  . Hemorrhoids   . Hyperlipemia    [place] study LDL goal= <160. NMR Lipoprofile 2007: LDL 188 (2618/2018) HDL 38, TG 127. LDL goal= <100, ideally <75  . Prostate cancer ([place])    Dr Janice Norrie  . Rhinitis    seasonal   Past Surgical History:  Procedure Laterality Date  . COLONOSCOPY  2012   negative; [facility]  . prostate seed implant  2008    Dr.Nesi  . TONSILLECTOMY      reports that he quit smoking about 33 years ago. He has never used smokeless tobacco. He reports that he drinks about 2.4 oz of alcohol per week. He reports that he does not use drugs. family history includes Heart attack (age of onset: 66) in his maternal grandfather; Heart attack (age of onset: 38) in his brother; Heart attack (age of onset: 76) in his maternal uncle; Heart disease in his brother; Hyperlipidemia in his  brother; Lung cancer in his mother; Other in his father; Prostate cancer in his brother. No Known Allergies Current Outpatient Medications on File Prior to Visit  Medication Sig Dispense Refill  . aspirin 81 MG tablet Take 81 mg by mouth daily.      . cyclobenzaprine (FLEXERIL) 5 MG tablet Take 1 tablet (5 mg total) by mouth at bedtime as needed for muscle spasms. 7 tablet 0  . levothyroxine (SYNTHROID, LEVOTHROID) 50 MCG tablet TAKE 1 TABLET BY MOUTH ONCE DAILY 90 tablet 0  . loratadine (CLARITIN) 10 MG tablet Take 10 mg by mouth daily as needed.    Marland Kitchen losartan (COZAAR) 25 MG tablet TAKE 1 TABLET BY MOUTH ONCE DAILY 90 tablet 0  . Multiple Vitamin (MULTI VITAMIN DAILY PO) Take by mouth.    . naproxen sodium (ALEVE) 220 MG tablet Take 1 tablet (220 mg total) by mouth 2 (two) times daily with a meal. 30 tablet   . omeprazole (PRILOSEC) 20 MG capsule TAKE 1 CAPSULE BY MOUTH ONCE DAILY 90 capsule 3  . Pseudoephedrine-Guaifenesin (MUCINEX D PO) Take by mouth.      . rosuvastatin (CRESTOR) 20 MG tablet Take 1 tablet (20 mg total) by mouth daily. 90 tablet 3  . sildenafil (VIAGRA) 100 MG tablet Take 100 mg by mouth daily as needed.       No current facility-administered medications on file prior to visit.    Review of Systems Constitutional: Negative for  other unusual diaphoresis, sweats, appetite or weight changes HENT: Negative for other worsening hearing loss, ear pain, facial swelling, mouth sores or neck stiffness.   Eyes: Negative for other worsening pain, redness or other visual disturbance.  Respiratory: Negative for other stridor or swelling Cardiovascular: Negative for other palpitations or other chest pain  Gastrointestinal: Negative for worsening diarrhea or loose stools, blood in stool, distention or other pain Genitourinary: Negative for hematuria, flank pain or other change in urine volume.  Musculoskeletal: Negative for myalgias or other joint swelling.  Skin: Negative for other  color change, or other wound or worsening drainage.  Neurological: Negative for other syncope or numbness. Hematological: Negative for other adenopathy or swelling Psychiatric/Behavioral: Negative for hallucinations, other worsening agitation, SI, self-injury, or new decreased concentration All other system neg per pt    Objective:   Physical Exam BP 128/86   Pulse 87   Temp 98.6 F (37 C) (Oral)   Ht 5\' 8"  (1.727 m)   Wt 191 lb (86.6 kg)   SpO2 96%   BMI 29.04 kg/m  VS noted, mild ill Constitutional: Pt is oriented to person, place, and time. Appears well-developed and well-nourished, in no significant distress and comfortable Head: Normocephalic and atraumatic  Eyes: Conjunctivae and EOM are normal. Pupils are equal, round, and reactive to light Right Ear: External ear normal without discharge Left Ear: External ear normal without discharge Nose: Nose without discharge or deformity Bilat tm's with mild erythema.  Max sinus areas non tender.  Pharynx with mild erythema, no exudate Mouth/Throat: Oropharynx is without other ulcerations and moist  Neck: Normal range of motion. Neck supple. No JVD present. No tracheal deviation present or significant neck LA or mass Cardiovascular: Normal rate, regular rhythm, normal heart sounds and intact distal pulses.   Pulmonary/Chest: WOB normal and breath sounds without rales or wheezing  Abdominal: Soft. Bowel sounds are normal. NT. No HSM  Musculoskeletal: Normal range of motion. Exhibits no edema Lymphadenopathy: Has no other cervical adenopathy.  Neurological: Pt is alert and oriented to person, place, and time. Pt has normal reflexes. No cranial nerve deficit. Motor grossly intact, Gait intact Skin: Skin is warm and dry. No rash noted or new ulcerations Psychiatric:  Has normal mood and affect. Behavior is normal without agitation No other exam findings  Lab Results  Component Value Date   WBC 6.4 02/09/2016   HGB 15.3 02/09/2016    HCT 44.3 02/09/2016   PLT 198.0 02/09/2016   GLUCOSE 93 02/09/2016   CHOL 172 02/09/2016   TRIG 138.0 02/09/2016   HDL 50.00 02/09/2016   LDLDIRECT 160.8 05/06/2007   LDLCALC 95 02/09/2016   ALT 20 02/09/2016   AST 26 02/09/2016   NA 139 02/09/2016   K 4.0 02/09/2016   CL 102 02/09/2016   CREATININE 1.00 02/09/2016   BUN 19 02/09/2016   CO2 32 02/09/2016   TSH 1.30 02/09/2016   PSA 0.00 Repeated and verified X2. (L) 02/09/2016   INR 0.9 12/15/2006   HGBA1C 5.8 06/06/2015        Assessment & Plan:

## 2017-08-29 NOTE — Patient Instructions (Signed)
Please take all new medication as prescribed - the antibiotic, and cough medicine as needed  Please continue all other medications as before, and refills have been done if requested.  Please have the pharmacy call with any other refills you may need.  Please continue your efforts at being more active, low cholesterol diet, and weight control.  You are otherwise up to date with prevention measures today.  Please keep your appointments with your specialists as you may have planned  Please go to the LAB in the Basement (turn left off the elevator) for the tests to be done today  You will be contacted by phone if any changes need to be made immediately.  Otherwise, you will receive a letter about your results with an explanation, but please check with MyChart first.  Please remember to sign up for MyChart if you have not done so, as this will be important to you in the future with finding out test results, communicating by private email, and scheduling acute appointments online when needed.  Please return in 1 year for your yearly visit, or sooner if needed, with Lab testing done 3-5 days before  

## 2017-08-30 NOTE — Assessment & Plan Note (Signed)
BP Readings from Last 3 Encounters:  08/29/17 128/86  08/13/16 120/70  02/21/16 120/78  stable overall by history and exam, recent data reviewed with pt, and pt to continue medical treatment as before,  to f/u any worsening symptoms or concerns

## 2017-08-30 NOTE — Assessment & Plan Note (Signed)

## 2017-08-30 NOTE — Assessment & Plan Note (Addendum)
Mild to mod, for antibx course, cough med prn, to f/u any worsening symptoms or concerns  In addition to the time spent performing CPE, I spent an additional 15 minutes face to face,in which greater than 50% of this time was spent in counseling and coordination of care for patient's illness as documented, including the differential dx, treatment, further evaluation and other management of acute URI, HTN

## 2017-10-01 ENCOUNTER — Other Ambulatory Visit: Payer: Self-pay | Admitting: Internal Medicine

## 2017-10-01 MED FILL — OMEPRAZOLE 20 MG CAP: 20 | 30 days supply | Qty: 30 | Fill #0

## 2017-11-03 ENCOUNTER — Other Ambulatory Visit: Payer: Self-pay | Admitting: Internal Medicine

## 2017-11-03 MED FILL — LEVOTHYROXINE 50 MCG TABLET: 50 | 90 days supply | Qty: 90 | Fill #0

## 2017-11-03 MED FILL — OMEPRAZOLE 20 MG CAP: 20 | 90 days supply | Qty: 90 | Fill #1

## 2018-01-01 MED FILL — LOSARTAN POTASSIUM 25 MG TA: 25 | 90 days supply | Qty: 90 | Fill #1

## 2018-02-03 ENCOUNTER — Other Ambulatory Visit: Payer: Self-pay | Admitting: Internal Medicine

## 2018-02-03 MED FILL — OMEPRAZOLE 20 MG CPDR: 20 | 90 days supply | Qty: 90 | Fill #2

## 2018-02-03 MED FILL — LEVOTHYROXINE 50 MCG TABLET: 50 | 90 days supply | Qty: 90 | Fill #0
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
## 2018-02-19 DIAGNOSIS — H2513 Age-related nuclear cataract, bilateral: Secondary | ICD-10-CM | POA: Diagnosis not present

## 2018-02-19 DIAGNOSIS — H25013 Cortical age-related cataract, bilateral: Secondary | ICD-10-CM | POA: Diagnosis not present

## 2018-02-19 DIAGNOSIS — H11823 Conjunctivochalasis, bilateral: Secondary | ICD-10-CM | POA: Diagnosis not present

## 2018-04-29 ENCOUNTER — Other Ambulatory Visit: Payer: Self-pay | Admitting: Internal Medicine

## 2018-04-29 MED FILL — ROSUVASTATIN CALCIUM 20 MG: 20 | 90 days supply | Qty: 90 | Fill #1

## 2018-04-29 MED FILL — OMEPRAZOLE 20 MG CPDR: 20 | 90 days supply | Qty: 90 | Fill #3

## 2018-04-29 MED FILL — LOSARTAN POTASSIUM 25 MG TA: 25 | 30 days supply | Qty: 30 | Fill #0

## 2018-04-29 MED FILL — LEVOTHYROXINE 50 MCG TABLET: 50 | 90 days supply | Qty: 90 | Fill #0

## 2018-05-28 ENCOUNTER — Ambulatory Visit (INDEPENDENT_AMBULATORY_CARE_PROVIDER_SITE_OTHER): Payer: Managed Care, Other (non HMO)

## 2018-05-28 DIAGNOSIS — Z23 Encounter for immunization: Secondary | ICD-10-CM

## 2018-06-22 MED FILL — LOSARTAN POTASSIUM 25 MG TA: 25 | 30 days supply | Qty: 30 | Fill #1

## 2018-07-29 MED FILL — OMEPRAZOLE 20 MG CPDR: 20 | 30 days supply | Qty: 30 | Fill #4

## 2018-07-29 MED FILL — LEVOTHYROXINE 50 MCG TABLET: 50 | 30 days supply | Qty: 30 | Fill #1

## 2018-08-17 ENCOUNTER — Encounter: Payer: Self-pay | Admitting: Internal Medicine

## 2018-08-18 MED ORDER — LOSARTAN POTASSIUM 50 MG PO TABS: 50.0000 mg | ORAL_TABLET | Freq: Every day | ORAL | 3 refills | Status: DC

## 2018-08-18 MED FILL — LOSARTAN POTASSIUM 50 MG TA: 50 | 30 days supply | Qty: 30 | Fill #0

## 2018-08-18 NOTE — Telephone Encounter (Signed)
Ok to contact pt -  \ It should be ok to increase the losartan to 50 mg per day (I sent a new prescription, and you can also take 2 of the 25 mg tabs until they are gone), and let us know in one week if most of your pressures are still higher than 130/80, as we could increase the medication again if needed, thanks

## 2018-09-04 MED FILL — OMEPRAZOLE 20 MG CPDR: 20 | 30 days supply | Qty: 30 | Fill #5

## 2018-09-28 ENCOUNTER — Other Ambulatory Visit: Payer: Self-pay | Admitting: Internal Medicine

## 2018-09-28 MED FILL — OMEPRAZOLE 20 MG CPDR: 20 | 30 days supply | Qty: 30 | Fill #0

## 2018-09-28 MED FILL — LEVOTHYROXINE 50 MCG TABLET: 50 | 30 days supply | Qty: 30 | Fill #2

## 2018-09-28 MED FILL — LOSARTAN POTASSIUM 50 MG TA: 50 | 30 days supply | Qty: 30 | Fill #1

## 2018-10-29 MED FILL — LEVOTHYROXINE 50 MCG TABLET: 50 | 30 days supply | Qty: 30 | Fill #3

## 2018-10-29 MED FILL — LOSARTAN POTASSIUM 50 MG TA: 50 | 30 days supply | Qty: 30 | Fill #2

## 2018-10-29 MED FILL — OMEPRAZOLE 20 MG CPDR: 20 | 30 days supply | Qty: 30 | Fill #1

## 2018-11-30 ENCOUNTER — Other Ambulatory Visit: Payer: Self-pay | Admitting: Internal Medicine

## 2018-11-30 MED FILL — OMEPRAZOLE 20 MG CPDR: 20 | 30 days supply | Qty: 30 | Fill #2

## 2018-11-30 MED FILL — LOSARTAN POTASSIUM 50 MG TA: 50 | 30 days supply | Qty: 30 | Fill #3

## 2018-11-30 MED FILL — LEVOTHYROXINE 50 MCG TABLET: 50 | 90 days supply | Qty: 90 | Fill #0

## 2019-01-11 ENCOUNTER — Other Ambulatory Visit: Payer: Self-pay | Admitting: Internal Medicine

## 2019-01-11 MED ORDER — OMEPRAZOLE 20 MG PO CPDR: 20.0000 mg | DELAYED_RELEASE_CAPSULE | Freq: Every day | ORAL | 0 refills | Status: DC

## 2019-01-11 MED FILL — OMEPRAZOLE 20 MG CAP: 20 | 90 days supply | Qty: 90 | Fill #0

## 2019-01-11 MED FILL — LOSARTAN POTASSIUM 50 MG TA: 50 | 30 days supply | Qty: 30 | Fill #4

## 2019-01-13 ENCOUNTER — Other Ambulatory Visit: Payer: Self-pay

## 2019-01-13 ENCOUNTER — Ambulatory Visit (INDEPENDENT_AMBULATORY_CARE_PROVIDER_SITE_OTHER): Payer: Medicare Other | Admitting: Internal Medicine

## 2019-01-13 ENCOUNTER — Other Ambulatory Visit (INDEPENDENT_AMBULATORY_CARE_PROVIDER_SITE_OTHER): Payer: Medicare Other

## 2019-01-13 ENCOUNTER — Other Ambulatory Visit: Payer: Self-pay | Admitting: Internal Medicine

## 2019-01-13 VITALS — BP 124/84 | HR 100 | Temp 98.5°F | Ht 68.0 in | Wt 192.0 lb

## 2019-01-13 DIAGNOSIS — Z125 Encounter for screening for malignant neoplasm of prostate: Secondary | ICD-10-CM | POA: Diagnosis not present

## 2019-01-13 DIAGNOSIS — E611 Iron deficiency: Secondary | ICD-10-CM | POA: Diagnosis not present

## 2019-01-13 DIAGNOSIS — Z23 Encounter for immunization: Secondary | ICD-10-CM | POA: Diagnosis not present

## 2019-01-13 DIAGNOSIS — E538 Deficiency of other specified B group vitamins: Secondary | ICD-10-CM

## 2019-01-13 DIAGNOSIS — E559 Vitamin D deficiency, unspecified: Secondary | ICD-10-CM | POA: Diagnosis not present

## 2019-01-13 DIAGNOSIS — Z0000 Encounter for general adult medical examination without abnormal findings: Secondary | ICD-10-CM

## 2019-01-13 LAB — BASIC METABOLIC PANEL
BUN: 11 mg/dL (ref 6–23)
CO2: 25 mEq/L (ref 19–32)
Calcium: 9.3 mg/dL (ref 8.4–10.5)
Chloride: 105 mEq/L (ref 96–112)
Creatinine, Ser: 0.94 mg/dL (ref 0.40–1.50)
GFR: 97.31 mL/min (ref >60.00)
Glucose, Bld: 100 mg/dL — ABNORMAL HIGH (ref 70–99)
Potassium: 3.8 mEq/L (ref 3.5–5.1)
Sodium: 140 mEq/L (ref 135–145)

## 2019-01-13 LAB — CBC WITH DIFFERENTIAL/PLATELET
Basophils Absolute: 0 10*3/uL (ref 0.0–0.1)
Basophils Relative: 0.7 % (ref 0.0–3.0)
Eosinophils Absolute: 0.1 10*3/uL (ref 0.0–0.7)
Eosinophils Relative: 1.2 % (ref 0.0–5.0)
HCT: 44.5 % (ref 39.0–52.0)
Hemoglobin: 15 g/dL (ref 13.0–17.0)
Lymphocytes Relative: 22.3 % (ref 12.0–46.0)
Lymphs Abs: 1.5 10*3/uL (ref 0.7–4.0)
MCHC: 33.8 g/dL (ref 30.0–36.0)
MCV: 88.4 fl (ref 78.0–100.0)
Monocytes Absolute: 0.6 10*3/uL (ref 0.1–1.0)
Monocytes Relative: 8.8 % (ref 3.0–12.0)
Neutro Abs: 4.4 10*3/uL (ref 1.4–7.7)
Neutrophils Relative %: 67 % (ref 43.0–77.0)
Platelets: 218 10*3/uL (ref 150.0–400.0)
RBC: 5.03 Mil/uL (ref 4.22–5.81)
RDW: 14.5 % (ref 11.5–15.5)
WBC: 6.6 10*3/uL (ref 4.0–10.5)

## 2019-01-13 LAB — HEPATIC FUNCTION PANEL
ALT: 15 U/L (ref 0–53)
AST: 22 U/L (ref 0–37)
Albumin: 4.8 g/dL (ref 3.5–5.2)
Alkaline Phosphatase: 55 U/L (ref 39–117)
Bilirubin, Direct: 0.1 mg/dL (ref 0.0–0.3)
Total Bilirubin: 0.7 mg/dL (ref 0.2–1.2)
Total Protein: 7.1 g/dL (ref 6.0–8.3)

## 2019-01-13 LAB — LIPID PANEL
Cholesterol: 162 mg/dL (ref 0–200)
HDL: 55.4 mg/dL (ref >39.00)
LDL Cholesterol: 79 mg/dL (ref 0–99)
NonHDL: 106.35
Total CHOL/HDL Ratio: 3
Triglycerides: 136 mg/dL (ref 0.0–149.0)
VLDL: 27.2 mg/dL (ref 0.0–40.0)

## 2019-01-13 LAB — URINALYSIS, ROUTINE W REFLEX MICROSCOPIC
Bilirubin Urine: NEGATIVE
Ketones, ur: NEGATIVE
Leukocytes,Ua: NEGATIVE
Nitrite: NEGATIVE
Specific Gravity, Urine: >=1.03 — AB (ref 1.000–1.030)
Total Protein, Urine: NEGATIVE
Urine Glucose: NEGATIVE
Urobilinogen, UA: 0.2 (ref 0.0–1.0)
pH: 5 (ref 5.0–8.0)

## 2019-01-13 LAB — TSH: TSH: 1.59 u[IU]/mL (ref 0.35–4.50)

## 2019-01-13 LAB — IBC PANEL
Iron: 73 ug/dL (ref 42–165)
Saturation Ratios: 17.7 % — ABNORMAL LOW (ref 20.0–50.0)
Transferrin: 294 mg/dL (ref 212.0–360.0)

## 2019-01-13 LAB — VITAMIN D 25 HYDROXY (VIT D DEFICIENCY, FRACTURES): VITD: 20.26 ng/mL — ABNORMAL LOW (ref 30.00–100.00)

## 2019-01-13 LAB — VITAMIN B12: Vitamin B-12: 277 pg/mL (ref 211–911)

## 2019-01-13 LAB — PSA: PSA: 0 ng/mL — ABNORMAL LOW (ref 0.10–4.00)

## 2019-01-13 MED ORDER — LOSARTAN POTASSIUM 50 MG PO TABS: 50.0000 mg | ORAL_TABLET | Freq: Every day | ORAL | 3 refills | Status: DC

## 2019-01-13 MED ORDER — ROSUVASTATIN CALCIUM 20 MG PO TABS: 20.0000 mg | ORAL_TABLET | Freq: Every day | ORAL | 3 refills | Status: DC

## 2019-01-13 MED ORDER — LEVOTHYROXINE SODIUM 50 MCG PO TABS: 50.0000 ug | ORAL_TABLET | Freq: Every day | ORAL | 3 refills | Status: DC

## 2019-01-13 MED ORDER — VITAMIN D (ERGOCALCIFEROL) 1.25 MG (50000 UNIT) PO CAPS: 50000.0000 [IU] | ORAL_CAPSULE | ORAL | 0 refills | Status: DC

## 2019-01-13 MED ORDER — OMEPRAZOLE 20 MG PO CPDR: 20.0000 mg | DELAYED_RELEASE_CAPSULE | Freq: Every day | ORAL | 3 refills | Status: DC

## 2019-01-13 MED FILL — ROSUVASTATIN CALCIUM 20 MG: 20 | 90 days supply | Qty: 90 | Fill #0

## 2019-01-13 NOTE — Patient Instructions (Signed)

## 2019-01-13 NOTE — Progress Notes (Signed)
Subjective:    Patient ID: Jose Stevens, male    DOB: [birth year], 63 y.o.   MRN: [id number]  HPI  Here for wellness and f/u;  Overall doing ok;  Pt denies Chest pain, worsening SOB, DOE, wheezing, orthopnea, PND, worsening LE edema, palpitations, dizziness or syncope.  Pt denies neurological change such as new headache, facial or extremity weakness, but does have intermittent UE numbness to the neck, declines MRI c spine  Pt denies polydipsia, polyuria, or low sugar symptoms. Pt states overall good compliance with treatment and medications, good tolerability, and has been trying to follow appropriate diet.  Pt denies worsening depressive symptoms, suicidal ideation or panic. No fever, night sweats, wt loss, loss of appetite, or other constitutional symptoms.  Pt states good ability with ADL's, has low fall risk, home safety reviewed and adequate, no other significant changes in hearing or vision, and only occasionally active with exercise.  NO other new complaints Wt Readings from Last 3 Encounters:  01/13/19 192 lb (87.1 kg)  08/29/17 191 lb (86.6 kg)  08/13/16 187 lb (84.8 kg)  Declines Tdap.   Past Medical History:  Diagnosis Date  . Hemorrhoids   . Hyperlipemia    [place] study LDL goal= <160. NMR Lipoprofile 2007: LDL 188 (2618/2018) HDL 38, TG 127. LDL goal= <100, ideally <75  . Prostate cancer ([place])    Dr Janice Norrie  . Rhinitis    seasonal   Past Surgical History:  Procedure Laterality Date  . COLONOSCOPY  2012   negative;  [facility]  . prostate seed implant  2008    Dr.Nesi  . TONSILLECTOMY      reports that he quit smoking about 34 years ago. He has never used smokeless tobacco. He reports current alcohol use of about 4.0 standard drinks of alcohol per week. He reports that he does not use drugs. family history includes Heart attack (age of onset: 24) in his maternal grandfather; Heart attack (age of onset: 28) in his brother; Heart attack (age of onset: 4) in his maternal  uncle; Heart disease in his brother; Hyperlipidemia in his brother; Lung cancer in his mother; Other in his father; Prostate cancer in his brother. No Known Allergies Current Outpatient Medications on File Prior to Visit  Medication Sig Dispense Refill  . aspirin 81 MG tablet Take 81 mg by mouth daily.      . cyclobenzaprine (FLEXERIL) 5 MG tablet Take 1 tablet (5 mg total) by mouth at bedtime as needed for muscle spasms. 7 tablet 0  . loratadine (CLARITIN) 10 MG tablet Take 10 mg by mouth daily as needed.    . Multiple Vitamin (MULTI VITAMIN DAILY PO) Take by mouth.    . naproxen sodium (ALEVE) 220 MG tablet Take 1 tablet (220 mg total) by mouth 2 (two) times daily with a meal. 30 tablet   . Pseudoephedrine-Guaifenesin (MUCINEX D PO) Take by mouth.      . sildenafil (VIAGRA) 100 MG tablet Take 100 mg by mouth daily as needed.       No current facility-administered medications on file prior to visit.    Review of Systems Constitutional: Negative for other unusual diaphoresis, sweats, appetite or weight changes HENT: Negative for other worsening hearing loss, ear pain, facial swelling, mouth sores or neck stiffness.   Eyes: Negative for other worsening pain, redness or other visual disturbance.  Respiratory: Negative for other stridor or swelling Cardiovascular: Negative for other palpitations or other chest pain  Gastrointestinal: Negative for  worsening diarrhea or loose stools, blood in stool, distention or other pain Genitourinary: Negative for hematuria, flank pain or other change in urine volume.  Musculoskeletal: Negative for myalgias or other joint swelling.  Skin: Negative for other color change, or other wound or worsening drainage.  Neurological: Negative for other syncope or numbness. Hematological: Negative for other adenopathy or swelling Psychiatric/Behavioral: Negative for hallucinations, other worsening agitation, SI, self-injury, or new decreased concentration All other  system neg per pt    Objective:   Physical Exam BP 124/84   Pulse 100   Temp 98.5 F (36.9 C) (Oral)   Ht 5\' 8"  (1.727 m)   Wt 192 lb (87.1 kg)   SpO2 96%   BMI 29.19 kg/m  VS noted,  Constitutional: Pt is oriented to person, place, and time. Appears well-developed and well-nourished, in no significant distress and comfortable Head: Normocephalic and atraumatic  Eyes: Conjunctivae and EOM are normal. Pupils are equal, round, and reactive to light Right Ear: External ear normal without discharge Left Ear: External ear normal without discharge Nose: Nose without discharge or deformity Mouth/Throat: Oropharynx is without other ulcerations and moist  Neck: Normal range of motion. Neck supple. No JVD present. No tracheal deviation present or significant neck LA or mass Cardiovascular: Normal rate, regular rhythm, normal heart sounds and intact distal pulses.   Pulmonary/Chest: WOB normal and breath sounds without rales or wheezing  Abdominal: Soft. Bowel sounds are normal. NT. No HSM  Musculoskeletal: Normal range of motion. Exhibits no edema Lymphadenopathy: Has no other cervical adenopathy.  Neurological: Pt is alert and oriented to person, place, and time. Pt has normal reflexes. No cranial nerve deficit. Motor grossly intact, Gait intact Skin: Skin is warm and dry. No rash noted or new ulcerations Psychiatric:  Has normal mood and affect. Behavior is normal without agitation No other exam findings Lab Results  Component Value Date   WBC 6.6 01/13/2019   HGB 15.0 01/13/2019   HCT 44.5 01/13/2019   PLT 218.0 01/13/2019   GLUCOSE 100 (H) 01/13/2019   CHOL 162 01/13/2019   TRIG 136.0 01/13/2019   HDL 55.40 01/13/2019   LDLDIRECT 160.8 05/06/2007   LDLCALC 79 01/13/2019   ALT 15 01/13/2019   AST 22 01/13/2019   NA 140 01/13/2019   K 3.8 01/13/2019   CL 105 01/13/2019   CREATININE 0.94 01/13/2019   BUN 11 01/13/2019   CO2 25 01/13/2019   TSH 1.59 01/13/2019   PSA 0.00 (L)  01/13/2019   INR 0.9 12/15/2006   HGBA1C 5.8 06/06/2015      Assessment & Plan:

## 2019-01-14 ENCOUNTER — Telehealth: Payer: Self-pay

## 2019-01-14 MED FILL — VIT D2 1.25 MG (50,000 UNIT: 1.25 MG | 84 days supply | Qty: 12 | Fill #0

## 2019-01-14 NOTE — Telephone Encounter (Signed)
-----   Message from Biagio Borg, MD sent at 01/13/2019  8:26 PM EDT ----- Left message on MyChart, pt to cont same tx except  The test results show that your current treatment is OK, as the tests are stable, except for a low Vitamin D level.  Please take Vitamin D 50000 units weekly for 12 weeks, then plan to change to OTC Vitamin D3 at 2000 units per day, indefinitely.   Aly Seidenberg to please inform pt, I will do rx

## 2019-01-14 NOTE — Telephone Encounter (Signed)
Pt has viewed results via MyChart  

## 2019-01-15 ENCOUNTER — Encounter: Payer: Self-pay | Admitting: Internal Medicine

## 2019-01-15 NOTE — Assessment & Plan Note (Signed)

## 2019-01-22 MED FILL — VIT D2 1.25 MG (50,000 UNIT: 1.25 MG | 84 days supply | Qty: 12 | Fill #0

## 2019-01-22 MED FILL — ROSUVASTATIN CALCIUM 20 MG: 20 | 90 days supply | Qty: 90 | Fill #0

## 2019-02-18 ENCOUNTER — Other Ambulatory Visit: Payer: Self-pay

## 2019-02-18 ENCOUNTER — Ambulatory Visit (INDEPENDENT_AMBULATORY_CARE_PROVIDER_SITE_OTHER): Payer: Medicare Other

## 2019-02-18 DIAGNOSIS — Z299 Encounter for prophylactic measures, unspecified: Secondary | ICD-10-CM

## 2019-02-18 DIAGNOSIS — Z23 Encounter for immunization: Secondary | ICD-10-CM

## 2019-02-24 MED FILL — LOSARTAN POTASSIUM 50 MG TA: 50 | 30 days supply | Qty: 30 | Fill #5

## 2019-02-24 MED FILL — LEVOTHYROXINE 50 MCG TABLET: 50 | 90 days supply | Qty: 90 | Fill #0

## 2019-03-20 MED FILL — OMEPRAZOLE 20 MG CAP: 20 | 90 days supply | Qty: 90 | Fill #0

## 2019-04-07 MED FILL — LOSARTAN POTASSIUM 50 MG TA: 50 | 90 days supply | Qty: 90 | Fill #6

## 2019-04-13 ENCOUNTER — Other Ambulatory Visit: Payer: Self-pay | Admitting: Internal Medicine

## 2019-04-13 MED FILL — ROSUVASTATIN CALCIUM 20 MG: 20 | 90 days supply | Qty: 90 | Fill #1

## 2019-04-13 MED FILL — OMEPRAZOLE 20 MG CAP: 20 | 90 days supply | Qty: 90 | Fill #0

## 2019-04-16 ENCOUNTER — Other Ambulatory Visit: Payer: Self-pay | Admitting: Internal Medicine

## 2019-05-28 MED FILL — LEVOTHYROXINE 50 MCG TABLET: 50 | 90 days supply | Qty: 90 | Fill #1

## 2019-07-12 MED FILL — OMEPRAZOLE 20 MG CAP: 20 | 90 days supply | Qty: 90 | Fill #1

## 2019-09-01 ENCOUNTER — Other Ambulatory Visit: Payer: Self-pay | Admitting: Internal Medicine

## 2019-09-01 MED FILL — LEVOTHYROXINE 50 MCG TABLET: 50 | 90 days supply | Qty: 90 | Fill #2

## 2019-09-01 MED FILL — LOSARTAN POTASSIUM 50 MG TA: 50 | 30 days supply | Qty: 30 | Fill #0

## 2019-09-01 MED FILL — ROSUVASTATIN CALCIUM 20 MG: 20 | 90 days supply | Qty: 90 | Fill #2

## 2019-10-13 MED FILL — LOSARTAN POTASSIUM 50 MG TA: 50 | 30 days supply | Qty: 30 | Fill #1

## 2019-10-13 MED FILL — OMEPRAZOLE 20 MG CAP: 20 | 90 days supply | Qty: 90 | Fill #2

## 2019-10-14 ENCOUNTER — Other Ambulatory Visit: Payer: Self-pay | Admitting: Internal Medicine

## 2019-12-01 MED FILL — ROSUVASTATIN CALCIUM 20 MG: 20 | 90 days supply | Qty: 90 | Fill #3

## 2019-12-01 MED FILL — LEVOTHYROXINE 50 MCG TABLET: 50 | 90 days supply | Qty: 90 | Fill #3

## 2019-12-01 MED FILL — LOSARTAN POTASSIUM 50 MG TA: 50 | 30 days supply | Qty: 30 | Fill #2

## 2020-01-12 MED FILL — LOSARTAN POTASSIUM 50 MG TA: 50 | 30 days supply | Qty: 30 | Fill #3

## 2020-01-12 MED FILL — OMEPRAZOLE 20 MG CAP: 20 | 90 days supply | Qty: 90 | Fill #3

## 2020-01-14 ENCOUNTER — Encounter: Payer: Medicare Other | Admitting: Internal Medicine

## 2020-01-21 ENCOUNTER — Encounter: Payer: Medicare Other | Admitting: Internal Medicine

## 2020-01-28 NOTE — Progress Notes (Signed)
This encounter was created in error - please disregard.

## 2020-02-21 ENCOUNTER — Encounter: Payer: Medicare Other | Admitting: Internal Medicine

## 2020-02-23 ENCOUNTER — Other Ambulatory Visit: Payer: Self-pay | Admitting: Internal Medicine

## 2020-02-23 ENCOUNTER — Other Ambulatory Visit (HOSPITAL_COMMUNITY): Payer: Self-pay | Admitting: Internal Medicine

## 2020-02-23 MED FILL — ROSUVASTATIN CALCIUM 20 MG: 20 | 30 days supply | Qty: 30 | Fill #0

## 2020-02-23 MED FILL — LOSARTAN POTASSIUM 50 MG TA: 50 | 30 days supply | Qty: 30 | Fill #0

## 2020-02-23 MED FILL — LEVOTHYROXINE 50 MCG TABLET: 50 | 30 days supply | Qty: 30 | Fill #0

## 2020-03-09 ENCOUNTER — Other Ambulatory Visit: Payer: Self-pay

## 2020-03-09 ENCOUNTER — Encounter: Payer: Self-pay | Admitting: Internal Medicine

## 2020-03-09 ENCOUNTER — Ambulatory Visit (INDEPENDENT_AMBULATORY_CARE_PROVIDER_SITE_OTHER): Payer: Medicare Other | Admitting: Internal Medicine

## 2020-03-09 ENCOUNTER — Other Ambulatory Visit (HOSPITAL_COMMUNITY): Payer: Self-pay | Admitting: Internal Medicine

## 2020-03-09 VITALS — BP 140/86 | HR 89 | Temp 98.2°F | Ht 68.0 in | Wt 186.0 lb

## 2020-03-09 DIAGNOSIS — C61 Malignant neoplasm of prostate: Secondary | ICD-10-CM

## 2020-03-09 DIAGNOSIS — Z23 Encounter for immunization: Secondary | ICD-10-CM

## 2020-03-09 DIAGNOSIS — I10 Essential (primary) hypertension: Secondary | ICD-10-CM | POA: Diagnosis not present

## 2020-03-09 DIAGNOSIS — Z0000 Encounter for general adult medical examination without abnormal findings: Secondary | ICD-10-CM | POA: Diagnosis not present

## 2020-03-09 DIAGNOSIS — R739 Hyperglycemia, unspecified: Secondary | ICD-10-CM

## 2020-03-09 DIAGNOSIS — E559 Vitamin D deficiency, unspecified: Secondary | ICD-10-CM | POA: Diagnosis not present

## 2020-03-09 LAB — URINALYSIS, ROUTINE W REFLEX MICROSCOPIC
Bilirubin Urine: NEGATIVE
Ketones, ur: 15 — AB
Leukocytes,Ua: NEGATIVE
Nitrite: NEGATIVE
Specific Gravity, Urine: 1.02 (ref 1.000–1.030)
Total Protein, Urine: NEGATIVE
Urine Glucose: NEGATIVE
Urobilinogen, UA: 0.2 (ref 0.0–1.0)
pH: 5.5 (ref 5.0–8.0)

## 2020-03-09 LAB — LIPID PANEL
Cholesterol: 151 mg/dL (ref 0–200)
HDL: 55 mg/dL (ref >39.00)
LDL Cholesterol: 73 mg/dL (ref 0–99)
NonHDL: 95.91
Total CHOL/HDL Ratio: 3
Triglycerides: 116 mg/dL (ref 0.0–149.0)
VLDL: 23.2 mg/dL (ref 0.0–40.0)

## 2020-03-09 LAB — HEPATIC FUNCTION PANEL
ALT: 17 U/L (ref 0–53)
AST: 24 U/L (ref 0–37)
Albumin: 4.4 g/dL (ref 3.5–5.2)
Alkaline Phosphatase: 57 U/L (ref 39–117)
Bilirubin, Direct: 0.2 mg/dL (ref 0.0–0.3)
Total Bilirubin: 0.9 mg/dL (ref 0.2–1.2)
Total Protein: 6.5 g/dL (ref 6.0–8.3)

## 2020-03-09 LAB — CBC WITH DIFFERENTIAL/PLATELET
Basophils Absolute: 0 10*3/uL (ref 0.0–0.1)
Basophils Relative: 0.5 % (ref 0.0–3.0)
Eosinophils Absolute: 0.1 10*3/uL (ref 0.0–0.7)
Eosinophils Relative: 1.7 % (ref 0.0–5.0)
HCT: 46 % (ref 39.0–52.0)
Hemoglobin: 15.4 g/dL (ref 13.0–17.0)
Lymphocytes Relative: 19.5 % (ref 12.0–46.0)
Lymphs Abs: 1.1 10*3/uL (ref 0.7–4.0)
MCHC: 33.5 g/dL (ref 30.0–36.0)
MCV: 88.3 fl (ref 78.0–100.0)
Monocytes Absolute: 0.4 10*3/uL (ref 0.1–1.0)
Monocytes Relative: 8 % (ref 3.0–12.0)
Neutro Abs: 3.9 10*3/uL (ref 1.4–7.7)
Neutrophils Relative %: 70.3 % (ref 43.0–77.0)
Platelets: 216 10*3/uL (ref 150.0–400.0)
RBC: 5.2 Mil/uL (ref 4.22–5.81)
RDW: 13.8 % (ref 11.5–15.5)
WBC: 5.6 10*3/uL (ref 4.0–10.5)

## 2020-03-09 LAB — HEMOGLOBIN A1C: Hgb A1c MFr Bld: 6.1 % (ref 4.6–6.5)

## 2020-03-09 LAB — TSH: TSH: 1.38 u[IU]/mL (ref 0.35–4.50)

## 2020-03-09 LAB — VITAMIN D 25 HYDROXY (VIT D DEFICIENCY, FRACTURES): VITD: 21.92 ng/mL — ABNORMAL LOW (ref 30.00–100.00)

## 2020-03-09 LAB — PSA: PSA: 0 ng/mL — ABNORMAL LOW (ref 0.10–4.00)

## 2020-03-09 MED ORDER — LEVOTHYROXINE SODIUM 50 MCG PO TABS
50.0000 ug | ORAL_TABLET | Freq: Every day | ORAL | 3 refills | Status: DC
Start: 2020-03-09 — End: 2020-11-16

## 2020-03-09 MED ORDER — LOSARTAN POTASSIUM 50 MG PO TABS
50.0000 mg | ORAL_TABLET | Freq: Every day | ORAL | 3 refills | Status: DC
Start: 2020-03-09 — End: 2020-11-16

## 2020-03-09 MED ORDER — OMEPRAZOLE 20 MG PO CPDR
20.0000 mg | DELAYED_RELEASE_CAPSULE | Freq: Every day | ORAL | 3 refills | Status: DC
Start: 2020-03-09 — End: 2020-11-16

## 2020-03-09 MED ORDER — ROSUVASTATIN CALCIUM 20 MG PO TABS
20.0000 mg | ORAL_TABLET | Freq: Every day | ORAL | 3 refills | Status: DC
Start: 2020-03-09 — End: 2020-11-16

## 2020-03-09 NOTE — Progress Notes (Signed)
Subjective:    Patient ID: Jose Stevens, male    DOB: May 03, 1956, 63 y.o.   MRN: [id number]  HPI  Here for wellness and f/u;  Overall doing ok;  Pt denies Chest pain, worsening SOB, DOE, wheezing, orthopnea, PND, worsening LE edema, palpitations, dizziness or syncope.  Pt denies neurological change such as new headache, facial or extremity weakness.  Pt denies polydipsia, polyuria, or low sugar symptoms. Pt states overall good compliance with treatment and medications, good tolerability, and has been trying to follow appropriate diet.  Pt denies worsening depressive symptoms, suicidal ideation or panic. No fever, night sweats, wt loss, loss of appetite, or other constitutional symptoms.  Pt states good ability with ADL's, has low fall risk, home safety reviewed and adequate, no other significant changes in hearing or vision, and only occasionally active with exercise. No new complaints Past Medical History:  Diagnosis Date  . Hemorrhoids   . Hyperlipemia    [place] study LDL goal= <160. NMR Lipoprofile 2007: LDL 188 (2618/2018) HDL 38, TG 127. LDL goal= <100, ideally <75  . Prostate cancer ([place])    Dr Janice Norrie  . Rhinitis    seasonal   Past Surgical History:  Procedure Laterality Date  . COLONOSCOPY  2012   negative; [facility]  . prostate seed implant  2008    Dr.Nesi  . TONSILLECTOMY      reports that he quit smoking about 35 years ago. He has never used smokeless tobacco. He reports current alcohol use of about 4.0 standard drinks of alcohol per week. He reports that he does not use drugs. family history includes Heart attack (age of onset: 31) in his maternal grandfather; Heart attack (age of onset: 41) in his brother; Heart attack (age of onset: 7) in his maternal uncle; Heart disease in his brother; Hyperlipidemia in his brother; Lung cancer in his mother; Other in his father; Prostate cancer in his brother. No Known Allergies Current Outpatient Medications on File Prior to Visit   Medication Sig Dispense Refill  . aspirin 81 MG tablet Take 81 mg by mouth daily.      . cyclobenzaprine (FLEXERIL) 5 MG tablet Take 1 tablet (5 mg total) by mouth at bedtime as needed for muscle spasms. 7 tablet 0  . loratadine (CLARITIN) 10 MG tablet Take 10 mg by mouth daily as needed.    . Multiple Vitamin (MULTI VITAMIN DAILY PO) Take by mouth.    . naproxen sodium (ALEVE) 220 MG tablet Take 1 tablet (220 mg total) by mouth 2 (two) times daily with a meal. 30 tablet   . Pseudoephedrine-Guaifenesin (MUCINEX D PO) Take by mouth.      . sildenafil (VIAGRA) 100 MG tablet Take 100 mg by mouth daily as needed.       No current facility-administered medications on file prior to visit.   Review of Systems All otherwise neg per pt     Objective:   Physical Exam BP 140/86 (BP Location: Left Arm, Patient Position: Sitting, Cuff Size: Large)   Pulse 89   Temp 98.2 F (36.8 C) (Oral)   Ht 5\' 8"  (1.727 m)   Wt 186 lb (84.4 kg)   SpO2 96%   BMI 28.28 kg/m  VS noted,  Constitutional: Pt appears in NAD HENT: Head: NCAT.  Right Ear: External ear normal.  Left Ear: External ear normal.  Eyes: . Pupils are equal, round, and reactive to light. Conjunctivae and EOM are normal Nose: without d/c or deformity  Neck: Neck supple. Gross normal ROM Cardiovascular: Normal rate and regular rhythm.   Pulmonary/Chest: Effort normal and breath sounds without rales or wheezing.  Abd:  Soft, NT, ND, + BS, no organomegaly Neurological: Pt is alert. At baseline orientation, motor grossly intact Skin: Skin is warm. No rashes, other new lesions, no LE edema Psychiatric: Pt behavior is normal without agitation  All otherwise neg per pt  Lab Results  Component Value Date   WBC 6.6 01/13/2019   HGB 15.0 01/13/2019   HCT 44.5 01/13/2019   PLT 218.0 01/13/2019   GLUCOSE 100 (H) 01/13/2019   CHOL 162 01/13/2019   TRIG 136.0 01/13/2019   HDL 55.40 01/13/2019   LDLDIRECT 160.8 05/06/2007   LDLCALC 79  01/13/2019   ALT 15 01/13/2019   AST 22 01/13/2019   NA 140 01/13/2019   K 3.8 01/13/2019   CL 105 01/13/2019   CREATININE 0.94 01/13/2019   BUN 11 01/13/2019   CO2 25 01/13/2019   TSH 1.59 01/13/2019   PSA 0.00 (L) 01/13/2019   INR 0.9 12/15/2006   HGBA1C 5.8 06/06/2015      Assessment & Plan:

## 2020-03-09 NOTE — Patient Instructions (Addendum)
You had the flu shot today  Please continue all other medications as before, and refills have been done if requested.  Please have the pharmacy call with any other refills you may need.  Please continue your efforts at being more active, low cholesterol diet, and weight control.  You are otherwise up to date with prevention measures today.  Please keep your appointments with your specialists as you may have planned  Please go to the LAB at the blood drawing area for the tests to be done  You will be contacted by phone if any changes need to be made immediately.  Otherwise, you will receive a letter about your results with an explanation, but please check with MyChart first.  In order to help keep our patients safe and at home we are billing the insurance company for a health consult. You could potentially get a copay to a maximum of $15. Do you agree to this in order to obtain our advice about your concerns?  Please make an Appointment to return for your 1 year visit, or sooner if needed

## 2020-03-10 ENCOUNTER — Encounter: Payer: Self-pay | Admitting: Internal Medicine

## 2020-03-10 DIAGNOSIS — F419 Anxiety disorder, unspecified: Secondary | ICD-10-CM

## 2020-03-10 LAB — BASIC METABOLIC PANEL
BUN: 11 mg/dL (ref 6–23)
CO2: 31 mEq/L (ref 19–32)
Calcium: 9.1 mg/dL (ref 8.4–10.5)
Chloride: 101 mEq/L (ref 96–112)
Creatinine, Ser: 0.89 mg/dL (ref 0.40–1.50)
GFR: 95 mL/min (ref >60.00)
Glucose, Bld: 81 mg/dL (ref 70–99)
Potassium: 3.9 mEq/L (ref 3.5–5.1)
Sodium: 137 mEq/L (ref 135–145)

## 2020-03-15 ENCOUNTER — Encounter: Payer: Self-pay | Admitting: Internal Medicine

## 2020-03-15 NOTE — Assessment & Plan Note (Signed)
To start vit d 2000 u qd 

## 2020-03-15 NOTE — Assessment & Plan Note (Signed)
Missed med today, encouraged compliance

## 2020-03-15 NOTE — Assessment & Plan Note (Signed)

## 2020-03-15 NOTE — Assessment & Plan Note (Signed)
Also for f/u psa 

## 2020-03-29 MED FILL — ROSUVASTATIN CALCIUM 20 MG: 20 | 90 days supply | Qty: 90 | Fill #0

## 2020-03-29 MED FILL — LOSARTAN POTASSIUM 50 MG TA: 50 | 90 days supply | Qty: 90 | Fill #0

## 2020-03-29 MED FILL — LEVOTHYROXINE 50 MCG TABLET: 50 | 90 days supply | Qty: 90 | Fill #0

## 2020-03-29 MED FILL — OMEPRAZOLE 20 MG CAP: 20 | 90 days supply | Qty: 90 | Fill #0

## 2020-06-29 MED FILL — ROSUVASTATIN CALCIUM 20 MG: 20 | 90 days supply | Qty: 90 | Fill #1

## 2020-06-29 MED FILL — OMEPRAZOLE 20 MG CAP: 20 | 90 days supply | Qty: 90 | Fill #1

## 2020-06-29 MED FILL — LEVOTHYROXINE 50 MCG TABLET: 50 | 90 days supply | Qty: 90 | Fill #1

## 2020-06-29 MED FILL — LOSARTAN POTASSIUM 50 MG TA: 50 | 30 days supply | Qty: 30 | Fill #1

## 2020-08-09 ENCOUNTER — Other Ambulatory Visit (HOSPITAL_COMMUNITY): Payer: Self-pay

## 2020-08-09 MED FILL — AZITHROMYCIN 500 MG TABLET: 500 | 3 days supply | Qty: 3 | Fill #0

## 2020-08-09 MED FILL — AZELASTINE HCL 137 MCG/SPRA: 137 | 25 days supply | Qty: 30 | Fill #0

## 2020-09-28 ENCOUNTER — Other Ambulatory Visit (HOSPITAL_COMMUNITY): Payer: Self-pay

## 2020-09-28 MED FILL — Losartan Potassium Tab 50 MG: ORAL | 90 days supply | Qty: 90 | Fill #0 | Status: AC

## 2020-09-28 MED FILL — Azelastine HCl Nasal Spray 0.1% (137 MCG/SPRAY): NASAL | 30 days supply | Qty: 30 | Fill #0 | Status: AC

## 2020-09-28 MED FILL — Levothyroxine Sodium Tab 50 MCG: ORAL | 90 days supply | Qty: 90 | Fill #0 | Status: AC

## 2020-09-28 MED FILL — Omeprazole Cap Delayed Release 20 MG: ORAL | 90 days supply | Qty: 90 | Fill #0 | Status: AC

## 2020-09-28 MED FILL — Rosuvastatin Calcium Tab 20 MG: ORAL | 90 days supply | Qty: 90 | Fill #0 | Status: AC

## 2020-09-29 ENCOUNTER — Other Ambulatory Visit (HOSPITAL_COMMUNITY): Payer: Self-pay

## 2020-11-12 ENCOUNTER — Emergency Department (HOSPITAL_BASED_OUTPATIENT_CLINIC_OR_DEPARTMENT_OTHER): Payer: PPO

## 2020-11-12 ENCOUNTER — Other Ambulatory Visit: Payer: Self-pay

## 2020-11-12 ENCOUNTER — Encounter (HOSPITAL_BASED_OUTPATIENT_CLINIC_OR_DEPARTMENT_OTHER): Payer: Self-pay | Admitting: Emergency Medicine

## 2020-11-12 ENCOUNTER — Emergency Department (HOSPITAL_BASED_OUTPATIENT_CLINIC_OR_DEPARTMENT_OTHER)
Admission: EM | Admit: 2020-11-12 | Discharge: 2020-11-12 | Disposition: A | Discharge status: Home / Self Care | Payer: PPO | Attending: Emergency Medicine | Admitting: Emergency Medicine

## 2020-11-12 DIAGNOSIS — I10 Essential (primary) hypertension: Secondary | ICD-10-CM | POA: Diagnosis not present

## 2020-11-12 DIAGNOSIS — T23001A Burn of unspecified degree of right hand, unspecified site, initial encounter: Secondary | ICD-10-CM | POA: Diagnosis present

## 2020-11-12 DIAGNOSIS — Y92009 Unspecified place in unspecified non-institutional (private) residence as the place of occurrence of the external cause: Secondary | ICD-10-CM | POA: Insufficient documentation

## 2020-11-12 DIAGNOSIS — Z87891 Personal history of nicotine dependence: Secondary | ICD-10-CM | POA: Insufficient documentation

## 2020-11-12 DIAGNOSIS — Z7982 Long term (current) use of aspirin: Secondary | ICD-10-CM | POA: Insufficient documentation

## 2020-11-12 DIAGNOSIS — E039 Hypothyroidism, unspecified: Secondary | ICD-10-CM | POA: Insufficient documentation

## 2020-11-12 DIAGNOSIS — Y900 Blood alcohol level of less than 20 mg/100 ml: Secondary | ICD-10-CM | POA: Diagnosis not present

## 2020-11-12 DIAGNOSIS — S0081XA Abrasion of other part of head, initial encounter: Secondary | ICD-10-CM | POA: Insufficient documentation

## 2020-11-12 DIAGNOSIS — W19XXXA Unspecified fall, initial encounter: Secondary | ICD-10-CM

## 2020-11-12 DIAGNOSIS — R0789 Other chest pain: Secondary | ICD-10-CM | POA: Diagnosis not present

## 2020-11-12 DIAGNOSIS — S61215A Laceration without foreign body of left ring finger without damage to nail, initial encounter: Secondary | ICD-10-CM

## 2020-11-12 DIAGNOSIS — S60415A Abrasion of left ring finger, initial encounter: Secondary | ICD-10-CM | POA: Diagnosis not present

## 2020-11-12 DIAGNOSIS — Z8546 Personal history of malignant neoplasm of prostate: Secondary | ICD-10-CM | POA: Insufficient documentation

## 2020-11-12 DIAGNOSIS — R42 Dizziness and giddiness: Secondary | ICD-10-CM | POA: Diagnosis not present

## 2020-11-12 DIAGNOSIS — W07XXXA Fall from chair, initial encounter: Secondary | ICD-10-CM | POA: Diagnosis not present

## 2020-11-12 DIAGNOSIS — Z23 Encounter for immunization: Secondary | ICD-10-CM | POA: Diagnosis not present

## 2020-11-12 DIAGNOSIS — X088XXA Exposure to other specified smoke, fire and flames, initial encounter: Secondary | ICD-10-CM | POA: Diagnosis not present

## 2020-11-12 DIAGNOSIS — R079 Chest pain, unspecified: Secondary | ICD-10-CM

## 2020-11-12 DIAGNOSIS — T23261A Burn of second degree of back of right hand, initial encounter: Secondary | ICD-10-CM | POA: Diagnosis not present

## 2020-11-12 DIAGNOSIS — Z79899 Other long term (current) drug therapy: Secondary | ICD-10-CM | POA: Insufficient documentation

## 2020-11-12 DIAGNOSIS — S60419A Abrasion of unspecified finger, initial encounter: Secondary | ICD-10-CM

## 2020-11-12 DIAGNOSIS — S60512A Abrasion of left hand, initial encounter: Secondary | ICD-10-CM | POA: Diagnosis not present

## 2020-11-12 LAB — BASIC METABOLIC PANEL
Anion gap: 10 (ref 5–15)
BUN: 8 mg/dL (ref 8–23)
CO2: 25 mmol/L (ref 22–32)
Calcium: 9 mg/dL (ref 8.9–10.3)
Chloride: 100 mmol/L (ref 98–111)
Creatinine, Ser: 0.9 mg/dL (ref 0.61–1.24)
GFR, Estimated: >60 mL/min (ref >60)
Glucose, Bld: 107 mg/dL — ABNORMAL HIGH (ref 70–99)
Potassium: 4.3 mmol/L (ref 3.5–5.1)
Sodium: 135 mmol/L (ref 135–145)

## 2020-11-12 LAB — CBC WITH DIFFERENTIAL/PLATELET
Abs Immature Granulocytes: 0.01 10*3/uL (ref 0.00–0.07)
Basophils Absolute: 0 10*3/uL (ref 0.0–0.1)
Basophils Relative: 0 %
Eosinophils Absolute: 0 10*3/uL (ref 0.0–0.5)
Eosinophils Relative: 0 %
HCT: 46.9 % (ref 39.0–52.0)
Hemoglobin: 16 g/dL (ref 13.0–17.0)
Immature Granulocytes: 0 %
Lymphocytes Relative: 10 %
Lymphs Abs: 0.7 10*3/uL (ref 0.7–4.0)
MCH: 30 pg (ref 26.0–34.0)
MCHC: 34.1 g/dL (ref 30.0–36.0)
MCV: 88 fL (ref 80.0–100.0)
Monocytes Absolute: 0.3 10*3/uL (ref 0.1–1.0)
Monocytes Relative: 5 %
Neutro Abs: 5.5 10*3/uL (ref 1.7–7.7)
Neutrophils Relative %: 85 %
Platelets: 196 10*3/uL (ref 150–400)
RBC: 5.33 MIL/uL (ref 4.22–5.81)
RDW: 13.6 % (ref 11.5–15.5)
WBC: 6.6 10*3/uL (ref 4.0–10.5)
nRBC: 0 % (ref 0.0–0.2)

## 2020-11-12 LAB — TROPONIN I (HIGH SENSITIVITY)
Troponin I (High Sensitivity): 2 ng/L (ref <18)
Troponin I (High Sensitivity): 2 ng/L (ref <18)

## 2020-11-12 LAB — ETHANOL: Alcohol, Ethyl (B): <10 mg/dL (ref <10)

## 2020-11-12 MED ORDER — LIDOCAINE-EPINEPHRINE (PF) 2 %-1:200000 IJ SOLN
10.0000 mL | Freq: Once | INTRAMUSCULAR | Status: AC
  Administered 2020-11-12: 10 mL
  Filled 2020-11-12: qty 20

## 2020-11-12 MED ORDER — TETANUS-DIPHTH-ACELL PERTUSSIS 5-2.5-18.5 LF-MCG/0.5 IM SUSY
0.5000 mL | PREFILLED_SYRINGE | Freq: Once | INTRAMUSCULAR | Status: AC
  Administered 2020-11-12: 0.5 mL via INTRAMUSCULAR
  Filled 2020-11-12: qty 0.5

## 2020-11-12 MED ORDER — SILVER SULFADIAZINE 1 % EX CREA
TOPICAL_CREAM | Freq: Once | CUTANEOUS | Status: AC
  Filled 2020-11-12: qty 85
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
## 2020-11-12 NOTE — Discharge Instructions (Addendum)
Apply the Silvadene cream to the burn once a day.  When the blisters break, you should see a medical care provider to have the burn debrided.  If your chest discomfort is getting worse, please be reevaluated.  You refused to have a CT scan done of your head and cervical spine.  Because of this, I cannot be sure that you do not have a serious head or neck injury.  If you have any concerning symptoms, please return immediately to give [place] an opportunity to reevaluate you.

## 2020-11-12 NOTE — ED Notes (Signed)
Wounds covered with nonadherent bandages.
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
## 2020-11-12 NOTE — ED Notes (Signed)
Active bleeding to laceration on left hand. Dressing reinforced and pressure applied.

## 2020-11-12 NOTE — ED Notes (Signed)
Pt refused CT scan

## 2020-11-15 ENCOUNTER — Encounter: Payer: Self-pay | Admitting: Internal Medicine

## 2020-11-16 ENCOUNTER — Other Ambulatory Visit: Payer: Self-pay

## 2020-11-16 ENCOUNTER — Other Ambulatory Visit (HOSPITAL_COMMUNITY): Payer: Self-pay

## 2020-11-16 ENCOUNTER — Encounter: Payer: Self-pay | Admitting: Internal Medicine

## 2020-11-16 ENCOUNTER — Ambulatory Visit (INDEPENDENT_AMBULATORY_CARE_PROVIDER_SITE_OTHER): Payer: PPO | Admitting: Internal Medicine

## 2020-11-16 VITALS — BP 128/78 | HR 86 | Temp 98.0°F | Resp 18 | Ht 68.0 in | Wt 180.8 lb

## 2020-11-16 DIAGNOSIS — I10 Essential (primary) hypertension: Secondary | ICD-10-CM

## 2020-11-16 DIAGNOSIS — T23201D Burn of second degree of right hand, unspecified site, subsequent encounter: Secondary | ICD-10-CM

## 2020-11-16 DIAGNOSIS — E611 Iron deficiency: Secondary | ICD-10-CM | POA: Diagnosis not present

## 2020-11-16 DIAGNOSIS — R739 Hyperglycemia, unspecified: Secondary | ICD-10-CM

## 2020-11-16 DIAGNOSIS — N529 Male erectile dysfunction, unspecified: Secondary | ICD-10-CM

## 2020-11-16 DIAGNOSIS — L03012 Cellulitis of left finger: Secondary | ICD-10-CM

## 2020-11-16 DIAGNOSIS — E559 Vitamin D deficiency, unspecified: Secondary | ICD-10-CM | POA: Diagnosis not present

## 2020-11-16 DIAGNOSIS — E538 Deficiency of other specified B group vitamins: Secondary | ICD-10-CM | POA: Diagnosis not present

## 2020-11-16 DIAGNOSIS — T23001A Burn of unspecified degree of right hand, unspecified site, initial encounter: Secondary | ICD-10-CM | POA: Insufficient documentation

## 2020-11-16 DIAGNOSIS — Z0000 Encounter for general adult medical examination without abnormal findings: Secondary | ICD-10-CM

## 2020-11-16 MED ORDER — DOXYCYCLINE HYCLATE 100 MG PO TABS
100.0000 mg | ORAL_TABLET | Freq: Two times a day (BID) | ORAL | 0 refills | Status: DC
Start: 2020-11-16 — End: 2022-04-09
  Filled 2020-11-16: qty 20, 10d supply, fill #0

## 2020-11-16 MED ORDER — TADALAFIL 20 MG PO TABS
10.0000 mg | ORAL_TABLET | ORAL | 11 refills | Status: DC | PRN
  Filled 2020-11-16: qty 10, 20d supply, fill #0

## 2020-11-16 NOTE — Patient Instructions (Signed)
Your left 4th finger appears to be mild infection - Please take all new medication as prescribed - the antibiotic  Ok to continue dressing the 4th finger and the burns as you have been doing  Please take all new medication as prescribed - the cialis as needed  Please continue all other medications as before, and refills have been done if requested.  Please have the pharmacy call with any other refills you may need.  Please keep your appointments with your specialists as you may have planned  Please return in 1 week for stitches out, exam and for blood work that day

## 2020-11-16 NOTE — Assessment & Plan Note (Signed)
Last vitamin D Lab Results  Component Value Date   VD25OH 21.92 (L) 03/09/2020   Low, to start oral replacement

## 2020-11-16 NOTE — Progress Notes (Signed)
Patient ID: Jose Stevens, male   DOB: 1956-01-13, 65 y.o.   MRN: [id number]        Chief Complaint: follow up left 4th finger post fall, low vit d, ED       HPI:  Jose Stevens is a 65 y.o. male who present post fall with laceration and abrasion to left 4th finger now 6/26 with red, tender swelling x 2 days getting worse without fever, chills.  Not taking Vit d.  Has worsening Ed symptoms over the past 6 mo and asking for cialis prn.  Pt denies chest pain, increased sob or doe, wheezing, orthopnea, PND, increased LE swelling, palpitations, dizziness or syncope.   Pt denies polydipsia, polyuria, or new focal neuro s/s.  No new complaints    Also has relatively large burn area to lateral right hand between thumb and index finger but no sign of red, tender, swelling  Wt Readings from Last 3 Encounters:  11/16/20 180 lb 12.8 oz (82 kg)  11/12/20 180 lb (81.6 kg)  03/09/20 186 lb (84.4 kg)   BP Readings from Last 3 Encounters:  11/16/20 128/78  11/12/20 117/82  03/09/20 140/86         Past Medical History:  Diagnosis Date   Hemorrhoids    Hyperlipemia    [place] study LDL goal= <160. NMR Lipoprofile 2007: LDL 188 (2618/2018) HDL 38, TG 127. LDL goal= <100, ideally <75   Prostate cancer ([place])    Dr Janice Norrie   Rhinitis    seasonal   Past Surgical History:  Procedure Laterality Date   COLONOSCOPY  2012   negative; [facility]   prostate seed implant  2008    Dr.Nesi   TONSILLECTOMY      reports that he quit smoking about 36 years ago. He has never used smokeless tobacco. He reports current alcohol use of about 4.0 standard drinks of alcohol per week. He reports that he does not use drugs. family history includes Heart attack (age of onset: 57) in his maternal grandfather; Heart attack (age of onset: 49) in his brother; Heart attack (age of onset: 64) in his maternal uncle; Heart disease in his brother; Hyperlipidemia in his brother; Lung cancer in his mother; Other in his father;  Prostate cancer in his brother. No Known Allergies Current Outpatient Medications on File Prior to Visit  Medication Sig Dispense Refill   aspirin 81 MG tablet Take 81 mg by mouth daily.       Azelastine HCl 137 MCG/SPRAY SOLN USE 2 SPRAYS IN EACH NOSTRIL 2 TIMES DAILY 30 mL 5   cyclobenzaprine (FLEXERIL) 5 MG tablet Take 1 tablet (5 mg total) by mouth at bedtime as needed for muscle spasms. 7 tablet 0   levothyroxine (SYNTHROID) 50 MCG tablet TAKE 1 TABLET BY MOUTH ONCE A DAY 90 tablet 3   loratadine (CLARITIN) 10 MG tablet Take 10 mg by mouth daily as needed.     losartan (COZAAR) 50 MG tablet TAKE 1 TABLET BY MOUTH ONCE A DAY 90 tablet 3   Multiple Vitamin (MULTI VITAMIN DAILY PO) Take by mouth.     naproxen sodium (ALEVE) 220 MG tablet Take 1 tablet (220 mg total) by mouth 2 (two) times daily with a meal. 30 tablet    omeprazole (PRILOSEC) 20 MG capsule TAKE 1 CAPSULE BY MOUTH DAILY 90 capsule 3   rosuvastatin (CRESTOR) 20 MG tablet TAKE 1 TABLET BY MOUTH DAILY 90 tablet 3   sildenafil (VIAGRA) 100 MG tablet  Take 100 mg by mouth daily as needed.       No current facility-administered medications on file prior to visit.        ROS:  All others reviewed and negative.  Objective        PE:  BP 128/78   Pulse 86   Temp 98 F (36.7 C) (Oral)   Resp 18   Ht 5\' 8"  (1.727 m)   Wt 180 lb 12.8 oz (82 kg)   SpO2 98%   BMI 27.49 kg/m                 Constitutional: Pt appears in NAD               HENT: Head: NCAT.                Right Ear: External ear normal.                 Left Ear: External ear normal.                Eyes: . Pupils are equal, round, and reactive to light. Conjunctivae and EOM are normal               Nose: without d/c or deformity               Neck: Neck supple. Gross normal ROM               Cardiovascular: Normal rate and regular rhythm.                 Pulmonary/Chest: Effort normal and breath sounds without rales or wheezing.                Abd:  Soft, NT,  ND, + BS, no organomegaly               Neurological: Pt is alert. At baseline orientation, motor grossly intact               Skin: LE edema - none, left 4th finger with several stitches, but diffuse red, tedner swelling out of proportion to several very small abrasions in addition to the laceration                Psychiatric: Pt behavior is normal without agitation   Micro: none  Cardiac tracings I have personally interpreted today:  none  Pertinent Radiological findings (summarize): none   Lab Results  Component Value Date   WBC 6.6 11/12/2020   HGB 16.0 11/12/2020   HCT 46.9 11/12/2020   PLT 196 11/12/2020   GLUCOSE 107 (H) 11/12/2020   CHOL 151 03/09/2020   TRIG 116.0 03/09/2020   HDL 55.00 03/09/2020   LDLDIRECT 160.8 05/06/2007   LDLCALC 73 03/09/2020   ALT 17 03/09/2020   AST 24 03/09/2020   NA 135 11/12/2020   K 4.3 11/12/2020   CL 100 11/12/2020   CREATININE 0.90 11/12/2020   BUN 8 11/12/2020   CO2 25 11/12/2020   TSH 1.38 03/09/2020   PSA 0.00 (L) 03/09/2020   INR 0.9 12/15/2006   HGBA1C 6.1 03/09/2020   Assessment/Plan:  Jose Stevens is a 65 y.o. Black or African American [2] male with  has a past medical history of Hemorrhoids, Hyperlipemia, Prostate cancer ([place]), and Rhinitis.  Vitamin D deficiency Last vitamin D Lab Results  Component Value Date   VD25OH 21.92 (L) 03/09/2020   Low, to start oral replacement   Burn  of right hand Benign appearane without s/s infection, to continue same tx silvadene  Cellulitis of finger of left hand Mild to mod, for antibx course,  to f/u any worsening symptoms or concerns  Erectile dysfunction Ok for cialis prn,  to f/u any worsening symptoms or concerns  Essential hypertension BP Readings from Last 3 Encounters:  11/16/20 128/78  11/12/20 117/82  03/09/20 140/86   Stable, pt to continue medical treatment losartan Followup: Return in about 1 week (around 11/23/2020).  Cathlean Cower, MD 11/19/2020 9:09  PM [facility]

## 2020-11-19 ENCOUNTER — Encounter: Payer: Self-pay | Admitting: Internal Medicine

## 2020-11-19 NOTE — Assessment & Plan Note (Signed)
BP Readings from Last 3 Encounters:  11/16/20 128/78  11/12/20 117/82  03/09/20 140/86   Stable, pt to continue medical treatment losartan

## 2020-11-19 NOTE — Assessment & Plan Note (Signed)
Mild to mod, for antibx course,  to f/u any worsening symptoms or concerns 

## 2020-11-19 NOTE — Assessment & Plan Note (Signed)
Benign appearane without s/s infection, to continue same tx silvadene

## 2020-11-19 NOTE — Assessment & Plan Note (Signed)
Ok for cialis prn,  to f/u any worsening symptoms or concerns  

## 2020-11-24 ENCOUNTER — Ambulatory Visit (INDEPENDENT_AMBULATORY_CARE_PROVIDER_SITE_OTHER): Payer: PPO

## 2020-11-24 ENCOUNTER — Other Ambulatory Visit: Payer: Self-pay

## 2020-11-24 DIAGNOSIS — Z4802 Encounter for removal of sutures: Secondary | ICD-10-CM

## 2020-11-24 DIAGNOSIS — L03012 Cellulitis of left finger: Secondary | ICD-10-CM
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
## 2021-01-01 ENCOUNTER — Other Ambulatory Visit (HOSPITAL_COMMUNITY): Payer: Self-pay

## 2021-01-01 MED FILL — Levothyroxine Sodium Tab 50 MCG: ORAL | 90 days supply | Qty: 90 | Fill #1 | Status: AC

## 2021-01-01 MED FILL — Losartan Potassium Tab 50 MG: ORAL | 90 days supply | Qty: 90 | Fill #1 | Status: AC

## 2021-01-01 MED FILL — Rosuvastatin Calcium Tab 20 MG: ORAL | 90 days supply | Qty: 90 | Fill #1 | Status: CN

## 2021-01-01 MED FILL — Omeprazole Cap Delayed Release 20 MG: ORAL | 90 days supply | Qty: 90 | Fill #1 | Status: AC

## 2021-03-12 ENCOUNTER — Ambulatory Visit (INDEPENDENT_AMBULATORY_CARE_PROVIDER_SITE_OTHER): Payer: PPO | Admitting: Internal Medicine

## 2021-03-12 ENCOUNTER — Other Ambulatory Visit: Payer: Self-pay

## 2021-03-12 ENCOUNTER — Other Ambulatory Visit (HOSPITAL_COMMUNITY): Payer: Self-pay

## 2021-03-12 ENCOUNTER — Encounter: Payer: Self-pay | Admitting: Internal Medicine

## 2021-03-12 VITALS — BP 144/82 | HR 82 | Ht 68.0 in | Wt 180.0 lb

## 2021-03-12 DIAGNOSIS — E559 Vitamin D deficiency, unspecified: Secondary | ICD-10-CM

## 2021-03-12 DIAGNOSIS — Z0000 Encounter for general adult medical examination without abnormal findings: Secondary | ICD-10-CM

## 2021-03-12 DIAGNOSIS — E538 Deficiency of other specified B group vitamins: Secondary | ICD-10-CM | POA: Diagnosis not present

## 2021-03-12 DIAGNOSIS — E785 Hyperlipidemia, unspecified: Secondary | ICD-10-CM

## 2021-03-12 DIAGNOSIS — C61 Malignant neoplasm of prostate: Secondary | ICD-10-CM | POA: Diagnosis not present

## 2021-03-12 DIAGNOSIS — R739 Hyperglycemia, unspecified: Secondary | ICD-10-CM | POA: Diagnosis not present

## 2021-03-12 DIAGNOSIS — I10 Essential (primary) hypertension: Secondary | ICD-10-CM

## 2021-03-12 DIAGNOSIS — Z23 Encounter for immunization: Secondary | ICD-10-CM

## 2021-03-12 LAB — CBC WITH DIFFERENTIAL/PLATELET
Basophils Absolute: 0 10*3/uL (ref 0.0–0.1)
Basophils Relative: 0.4 % (ref 0.0–3.0)
Eosinophils Absolute: 0.1 10*3/uL (ref 0.0–0.7)
Eosinophils Relative: 1.3 % (ref 0.0–5.0)
HCT: 45.9 % (ref 39.0–52.0)
Hemoglobin: 15.1 g/dL (ref 13.0–17.0)
Lymphocytes Relative: 16.3 % (ref 12.0–46.0)
Lymphs Abs: 0.9 10*3/uL (ref 0.7–4.0)
MCHC: 32.8 g/dL (ref 30.0–36.0)
MCV: 89.4 fl (ref 78.0–100.0)
Monocytes Absolute: 0.4 10*3/uL (ref 0.1–1.0)
Monocytes Relative: 7.3 % (ref 3.0–12.0)
Neutro Abs: 4.2 10*3/uL (ref 1.4–7.7)
Neutrophils Relative %: 74.7 % (ref 43.0–77.0)
Platelets: 208 10*3/uL (ref 150.0–400.0)
RBC: 5.14 Mil/uL (ref 4.22–5.81)
RDW: 14.4 % (ref 11.5–15.5)
WBC: 5.7 10*3/uL (ref 4.0–10.5)

## 2021-03-12 LAB — LIPID PANEL
Cholesterol: 176 mg/dL (ref 0–200)
HDL: 65.5 mg/dL (ref >39.00)
LDL Cholesterol: 91 mg/dL (ref 0–99)
NonHDL: 110.05
Total CHOL/HDL Ratio: 3
Triglycerides: 94 mg/dL (ref 0.0–149.0)
VLDL: 18.8 mg/dL (ref 0.0–40.0)

## 2021-03-12 LAB — PSA: PSA: 0 ng/mL — ABNORMAL LOW (ref 0.10–4.00)

## 2021-03-12 LAB — HEPATIC FUNCTION PANEL
ALT: 16 U/L (ref 0–53)
AST: 20 U/L (ref 0–37)
Albumin: 4.5 g/dL (ref 3.5–5.2)
Alkaline Phosphatase: 60 U/L (ref 39–117)
Bilirubin, Direct: 0.1 mg/dL (ref 0.0–0.3)
Total Bilirubin: 0.7 mg/dL (ref 0.2–1.2)
Total Protein: 6.9 g/dL (ref 6.0–8.3)

## 2021-03-12 LAB — VITAMIN D 25 HYDROXY (VIT D DEFICIENCY, FRACTURES): VITD: 14.88 ng/mL — ABNORMAL LOW (ref 30.00–100.00)

## 2021-03-12 LAB — BASIC METABOLIC PANEL
BUN: 8 mg/dL (ref 6–23)
CO2: 31 mEq/L (ref 19–32)
Calcium: 9.5 mg/dL (ref 8.4–10.5)
Chloride: 102 mEq/L (ref 96–112)
Creatinine, Ser: 0.85 mg/dL (ref 0.40–1.50)
GFR: 89.83 mL/min (ref >60.00)
Glucose, Bld: 89 mg/dL (ref 70–99)
Potassium: 4.6 mEq/L (ref 3.5–5.1)
Sodium: 140 mEq/L (ref 135–145)

## 2021-03-12 LAB — VITAMIN B12: Vitamin B-12: 294 pg/mL (ref 211–911)

## 2021-03-12 LAB — TSH: TSH: 1.35 u[IU]/mL (ref 0.35–5.50)

## 2021-03-12 LAB — HEMOGLOBIN A1C: Hgb A1c MFr Bld: 6 % (ref 4.6–6.5)

## 2021-03-12 MED ORDER — SILDENAFIL CITRATE 100 MG PO TABS
100.0000 mg | ORAL_TABLET | Freq: Every day | ORAL | 11 refills | Status: AC | PRN
  Filled 2021-03-12: qty 10, 30d supply, fill #0
  Filled 2021-04-06: qty 10, 10d supply, fill #0

## 2021-03-12 NOTE — Progress Notes (Signed)
Patient ID: Jose Stevens, male   DOB: 10/26/1955, 65 y.o.   MRN: [id number]         Chief Complaint:: wellness exam and low vit d, hld, htn, ED       HPI:  Jose Stevens is a 65 y.o. male here for wellness exam; decliens covid vax, shingrix, colonscopy, o/w up to date and ok for flu shot and pneumovax                        Also not taking Vit d, trying to follow lower cho diet, BP at home < 140/90, and mild worsening ED symptoms over last several months.  Pt denies chest pain, increased sob or doe, wheezing, orthopnea, PND, increased LE swelling, palpitations, dizziness or syncope.    Wt Readings from Last 3 Encounters:  03/12/21 180 lb (81.6 kg)  11/16/20 180 lb 12.8 oz (82 kg)  11/12/20 180 lb (81.6 kg)   BP Readings from Last 3 Encounters:  03/12/21 (!) 144/82  11/16/20 128/78  11/12/20 117/82   Immunization History  Administered Date(s) Administered   Fluad Quad(high Dose 65+) 01/13/2019, 03/09/2020, 03/12/2021   Influenza Split 04/30/2011, 02/13/2012   Influenza Whole 03/30/2009   Influenza,inj,Quad PF,6+ Mos 05/19/2013, 06/06/2015, 02/09/2016, 06/17/2017, 05/28/2018   PFIZER(Purple Top)SARS-COV-2 Vaccination 06/11/2019, 07/02/2019, 03/27/2020   Pneumococcal Conjugate-13 02/18/2019   Pneumococcal Polysaccharide-23 03/12/2021   Tdap 11/12/2020   There are no preventive care reminders to display for this patient.     Past Medical History:  Diagnosis Date   Hemorrhoids    Hyperlipemia    [place] study LDL goal= <160. NMR Lipoprofile 2007: LDL 188 (2618/2018) HDL 38, TG 127. LDL goal= <100, ideally <75   Prostate cancer ([place])    Dr Janice Norrie   Rhinitis    seasonal   Past Surgical History:  Procedure Laterality Date   COLONOSCOPY  2012   negative; [facility]   prostate seed implant  2008    Dr.Nesi   TONSILLECTOMY      reports that he quit smoking about 36 years ago. His smoking use included cigarettes. He has never used smokeless tobacco. He reports current  alcohol use of about 4.0 standard drinks per week. He reports that he does not use drugs. family history includes Heart attack (age of onset: 22) in his maternal grandfather; Heart attack (age of onset: 1) in his brother; Heart attack (age of onset: 59) in his maternal uncle; Heart disease in his brother; Hyperlipidemia in his brother; Lung cancer in his mother; Other in his father; Prostate cancer in his brother. No Known Allergies Current Outpatient Medications on File Prior to Visit  Medication Sig Dispense Refill   aspirin 81 MG tablet Take 81 mg by mouth daily.       Azelastine HCl 137 MCG/SPRAY SOLN USE 2 SPRAYS IN EACH NOSTRIL 2 TIMES DAILY 30 mL 5   cyclobenzaprine (FLEXERIL) 5 MG tablet Take 1 tablet (5 mg total) by mouth at bedtime as needed for muscle spasms. 7 tablet 0   doxycycline (VIBRA-TABS) 100 MG tablet Take 1 tablet (100 mg total) by mouth 2 (two) times daily. 20 tablet 0   levothyroxine (SYNTHROID) 50 MCG tablet TAKE 1 TABLET BY MOUTH ONCE A DAY 90 tablet 3   loratadine (CLARITIN) 10 MG tablet Take 10 mg by mouth daily as needed.     losartan (COZAAR) 50 MG tablet TAKE 1 TABLET BY MOUTH ONCE A DAY 90 tablet 3  Multiple Vitamin (MULTI VITAMIN DAILY PO) Take by mouth.     naproxen sodium (ALEVE) 220 MG tablet Take 1 tablet (220 mg total) by mouth 2 (two) times daily with a meal. 30 tablet    omeprazole (PRILOSEC) 20 MG capsule TAKE 1 CAPSULE BY MOUTH DAILY 90 capsule 3   rosuvastatin (CRESTOR) 20 MG tablet TAKE 1 TABLET BY MOUTH DAILY 90 tablet 3   No current facility-administered medications on file prior to visit.        ROS:  All others reviewed and negative.  Objective        PE:  BP (!) 144/82 (BP Location: Right Arm, Patient Position: Sitting, Cuff Size: Large)   Pulse 82   Ht 5\' 8"  (1.727 m)   Wt 180 lb (81.6 kg)   SpO2 98%   BMI 27.37 kg/m                 Constitutional: Pt appears in NAD               HENT: Head: NCAT.                Right Ear: External  ear normal.                 Left Ear: External ear normal.                Eyes: . Pupils are equal, round, and reactive to light. Conjunctivae and EOM are normal               Nose: without d/c or deformity               Neck: Neck supple. Gross normal ROM               Cardiovascular: Normal rate and regular rhythm.                 Pulmonary/Chest: Effort normal and breath sounds without rales or wheezing.                Abd:  Soft, NT, ND, + BS, no organomegaly               Neurological: Pt is alert. At baseline orientation, motor grossly intact               Skin: Skin is warm. No rashes, no other new lesions, LE edema - none               Psychiatric: Pt behavior is normal without agitation   Micro: none  Cardiac tracings I have personally interpreted today:  none  Pertinent Radiological findings (summarize): none   Lab Results  Component Value Date   WBC 5.7 03/12/2021   HGB 15.1 03/12/2021   HCT 45.9 03/12/2021   PLT 208.0 03/12/2021   GLUCOSE 89 03/12/2021   CHOL 176 03/12/2021   TRIG 94.0 03/12/2021   HDL 65.50 03/12/2021   LDLDIRECT 160.8 05/06/2007   LDLCALC 91 03/12/2021   ALT 16 03/12/2021   AST 20 03/12/2021   NA 140 03/12/2021   K 4.6 03/12/2021   CL 102 03/12/2021   CREATININE 0.85 03/12/2021   BUN 8 03/12/2021   CO2 31 03/12/2021   TSH 1.35 03/12/2021   PSA 0.00 (L) 03/12/2021   INR 0.9 12/15/2006   HGBA1C 6.0 03/12/2021   Assessment/Plan:  Jose Stevens is a 65 y.o. Black or African American [2] male with  has a past medical history  of Hemorrhoids, Hyperlipemia, Prostate cancer ([place]), and Rhinitis.  Preventative health care Age and sex appropriate education and counseling updated with regular exercise and diet Referrals for preventative services - declines colnoscoyp Immunizations addressed - for flu shot, pneumvax, and declnes covid vax, shingrix Smoking counseling  - none needed Evidence for depression or other mood disorder - none  significant Most recent labs reviewed. I have personally reviewed and have noted: 1) the patient's medical and social history 2) The patient's current medications and supplements 3) The patient's height, weight, and BMI have been recorded in the chart   [place] and f/u with urology  Hyperlipidemia Goal ldl < 70, declines statin Lab Results  Component Value Date   [place] 91 03/12/2021     Essential hypertension Pt states bp at home < 140/90 , cont same tx BP Readings from Last 3 Encounters:  03/12/21 (!) 144/82  11/16/20 128/78  11/12/20 117/82     Vitamin D deficiency Last vitamin D Lab Results  Component Value Date   VD25OH 14.88 (L) 03/12/2021   Low, to start oral replacement  Followup: Return in about 1 year (around 03/12/2022).  Cathlean Cower, MD 03/19/2021 10:53 AM [facility]

## 2021-03-12 NOTE — Patient Instructions (Addendum)
Please check with your insurance about the Shingrix shingles shot  You had the Pneumovax pneumonia shot today  Please take all new medication as prescribed - the viagra  Please take OTC Vitamin D3 at 2000 units per day, indefinitely  Please continue all other medications as before, and refills have been done if requested.  Please have the pharmacy call with any other refills you may need.  Please continue your efforts at being more active, low cholesterol diet, and weight control.  You are otherwise up to date with prevention measures today.  Please keep your appointments with your specialists as you may have planned  Please go to the LAB at the blood drawing area for the tests to be done  You will be contacted by phone if any changes need to be made immediately.  Otherwise, you will receive a letter about your results with an explanation, but please check with MyChart first.  Please remember to sign up for MyChart if you have not done so, as this will be important to you in the future with finding out test results, communicating by private email, and scheduling acute appointments online when needed.  Please make an Appointment to return for your 1 year visit, or sooner if needed

## 2021-03-13 ENCOUNTER — Encounter: Payer: Self-pay | Admitting: Internal Medicine

## 2021-03-13 LAB — URINALYSIS, ROUTINE W REFLEX MICROSCOPIC
Bilirubin Urine: NEGATIVE
Ketones, ur: NEGATIVE
Leukocytes,Ua: NEGATIVE
Nitrite: NEGATIVE
RBC / HPF: NONE SEEN (ref 0–?)
Specific Gravity, Urine: 1.025 (ref 1.000–1.030)
Total Protein, Urine: NEGATIVE
Urine Glucose: NEGATIVE
Urobilinogen, UA: 0.2 (ref 0.0–1.0)
WBC, UA: NONE SEEN (ref 0–?)
pH: 5.5 (ref 5.0–8.0)

## 2021-03-19 ENCOUNTER — Encounter: Payer: Self-pay | Admitting: Internal Medicine

## 2021-03-19 NOTE — Assessment & Plan Note (Signed)
Pt states bp at home < 140/90 , cont same tx BP Readings from Last 3 Encounters:  03/12/21 (!) 144/82  11/16/20 128/78  11/12/20 117/82

## 2021-03-19 NOTE — Assessment & Plan Note (Signed)
Last vitamin D Lab Results  Component Value Date   VD25OH 14.88 (L) 03/12/2021   Low, to start oral replacement

## 2021-03-19 NOTE — Assessment & Plan Note (Signed)
Goal ldl < 70, declines statin Lab Results  Component Value Date   LDLCALC 91 03/12/2021

## 2021-03-19 NOTE — Assessment & Plan Note (Signed)
Age and sex appropriate education and counseling updated with regular exercise and diet Referrals for preventative services - declines colnoscoyp Immunizations addressed - for flu shot, pneumvax, and declnes covid vax, shingrix Smoking counseling  - none needed Evidence for depression or other mood disorder - none significant Most recent labs reviewed. I have personally reviewed and have noted: 1) the patient's medical and social history 2) The patient's current medications and supplements 3) The patient's height, weight, and BMI have been recorded in the chart

## 2021-03-19 NOTE — Assessment & Plan Note (Signed)
For psa and f/u with urology

## 2021-03-20 ENCOUNTER — Other Ambulatory Visit (HOSPITAL_COMMUNITY): Payer: Self-pay

## 2021-04-06 ENCOUNTER — Other Ambulatory Visit: Payer: Self-pay | Admitting: Internal Medicine

## 2021-04-06 ENCOUNTER — Other Ambulatory Visit (HOSPITAL_COMMUNITY): Payer: Self-pay

## 2021-04-06 MED ORDER — LOSARTAN POTASSIUM 50 MG PO TABS
ORAL_TABLET | Freq: Every day | ORAL | 3 refills | Status: DC
  Filled 2021-04-06: qty 90, 90d supply, fill #0
  Filled 2021-06-27: qty 90, 90d supply, fill #1
  Filled 2021-09-24: qty 90, 90d supply, fill #2
  Filled 2022-03-29: qty 90, 90d supply, fill #3

## 2021-04-06 MED ORDER — LEVOTHYROXINE SODIUM 50 MCG PO TABS
ORAL_TABLET | Freq: Every day | ORAL | 3 refills | Status: DC
  Filled 2021-04-06: qty 90, 90d supply, fill #0
  Filled 2021-06-27: qty 90, 90d supply, fill #1
  Filled 2021-09-24: qty 90, 90d supply, fill #2
  Filled 2021-12-31: qty 90, 90d supply, fill #3

## 2021-04-06 MED ORDER — OMEPRAZOLE 20 MG PO CPDR
DELAYED_RELEASE_CAPSULE | Freq: Every day | ORAL | 3 refills | Status: DC
  Filled 2021-04-06: qty 90, 90d supply, fill #0
  Filled 2021-06-27: qty 90, 90d supply, fill #1
  Filled 2021-09-24: qty 90, 90d supply, fill #2
  Filled 2021-12-31: qty 90, 90d supply, fill #3

## 2021-04-06 MED ORDER — ROSUVASTATIN CALCIUM 20 MG PO TABS
ORAL_TABLET | Freq: Every day | ORAL | 3 refills | Status: DC
  Filled 2021-04-06: qty 90, 90d supply, fill #0
  Filled 2021-06-27: qty 90, 90d supply, fill #1
  Filled 2021-09-24: qty 90, 90d supply, fill #2
  Filled 2022-03-29: qty 90, 90d supply, fill #3

## 2021-04-06 NOTE — Telephone Encounter (Signed)
Please refill as per office routine med refill policy (all routine meds to be refilled for 3 mo or monthly (per pt preference) up to one year from last visit, then month to month grace period for 3 mo, then further med refills will have to be denied) ? ?

## 2021-06-27 ENCOUNTER — Other Ambulatory Visit (HOSPITAL_COMMUNITY): Payer: Self-pay

## 2021-06-28 ENCOUNTER — Other Ambulatory Visit (HOSPITAL_COMMUNITY): Payer: Self-pay

## 2021-08-09 ENCOUNTER — Other Ambulatory Visit (HOSPITAL_COMMUNITY): Payer: Self-pay

## 2021-08-09 MED ORDER — AZITHROMYCIN 500 MG PO TABS
ORAL_TABLET | ORAL | 0 refills | Status: DC
  Filled 2021-08-09: qty 3, 3d supply, fill #0

## 2021-08-22 DIAGNOSIS — H40033 Anatomical narrow angle, bilateral: Secondary | ICD-10-CM | POA: Diagnosis not present

## 2021-08-22 DIAGNOSIS — H2513 Age-related nuclear cataract, bilateral: Secondary | ICD-10-CM | POA: Diagnosis not present

## 2021-09-24 ENCOUNTER — Other Ambulatory Visit (HOSPITAL_COMMUNITY): Payer: Self-pay

## 2021-09-26 ENCOUNTER — Other Ambulatory Visit (HOSPITAL_COMMUNITY): Payer: Self-pay

## 2021-11-15 ENCOUNTER — Other Ambulatory Visit (HOSPITAL_COMMUNITY): Payer: Self-pay

## 2021-11-15 MED ORDER — AMOXICILLIN 500 MG PO CAPS
ORAL_CAPSULE | ORAL | 0 refills | Status: DC
  Filled 2021-11-15: qty 21, 7d supply, fill #0

## 2021-12-31 ENCOUNTER — Other Ambulatory Visit (HOSPITAL_COMMUNITY): Payer: Self-pay

## 2022-01-03 ENCOUNTER — Other Ambulatory Visit (HOSPITAL_COMMUNITY): Payer: Self-pay

## 2022-03-18 ENCOUNTER — Encounter: Payer: PPO | Admitting: Internal Medicine

## 2022-03-22 ENCOUNTER — Ambulatory Visit (INDEPENDENT_AMBULATORY_CARE_PROVIDER_SITE_OTHER): Payer: PPO | Admitting: Internal Medicine

## 2022-03-22 VITALS — BP 134/78 | HR 93 | Temp 98.8°F | Ht 68.0 in | Wt 176.0 lb

## 2022-03-22 DIAGNOSIS — I10 Essential (primary) hypertension: Secondary | ICD-10-CM

## 2022-03-22 DIAGNOSIS — E559 Vitamin D deficiency, unspecified: Secondary | ICD-10-CM | POA: Diagnosis not present

## 2022-03-22 DIAGNOSIS — Z0001 Encounter for general adult medical examination with abnormal findings: Secondary | ICD-10-CM

## 2022-03-22 DIAGNOSIS — E538 Deficiency of other specified B group vitamins: Secondary | ICD-10-CM | POA: Diagnosis not present

## 2022-03-22 DIAGNOSIS — Z0000 Encounter for general adult medical examination without abnormal findings: Secondary | ICD-10-CM

## 2022-03-22 DIAGNOSIS — E039 Hypothyroidism, unspecified: Secondary | ICD-10-CM

## 2022-03-22 DIAGNOSIS — Z1211 Encounter for screening for malignant neoplasm of colon: Secondary | ICD-10-CM

## 2022-03-22 DIAGNOSIS — Z23 Encounter for immunization: Secondary | ICD-10-CM

## 2022-03-22 DIAGNOSIS — R739 Hyperglycemia, unspecified: Secondary | ICD-10-CM

## 2022-03-22 DIAGNOSIS — C61 Malignant neoplasm of prostate: Secondary | ICD-10-CM

## 2022-03-22 DIAGNOSIS — E785 Hyperlipidemia, unspecified: Secondary | ICD-10-CM | POA: Diagnosis not present

## 2022-03-22 LAB — BASIC METABOLIC PANEL
BUN: 10 mg/dL (ref 6–23)
CO2: 33 mEq/L — ABNORMAL HIGH (ref 19–32)
Calcium: 9.7 mg/dL (ref 8.4–10.5)
Chloride: 101 mEq/L (ref 96–112)
Creatinine, Ser: 0.83 mg/dL (ref 0.40–1.50)
GFR: 89.83 mL/min (ref >60.00)
Glucose, Bld: 83 mg/dL (ref 70–99)
Potassium: 4.4 mEq/L (ref 3.5–5.1)
Sodium: 139 mEq/L (ref 135–145)

## 2022-03-22 LAB — URINALYSIS, ROUTINE W REFLEX MICROSCOPIC
Bilirubin Urine: NEGATIVE
Ketones, ur: NEGATIVE
Leukocytes,Ua: NEGATIVE
Nitrite: NEGATIVE
Specific Gravity, Urine: <=1.005 — AB (ref 1.000–1.030)
Total Protein, Urine: NEGATIVE
Urine Glucose: NEGATIVE
Urobilinogen, UA: 0.2 (ref 0.0–1.0)
pH: 6 (ref 5.0–8.0)

## 2022-03-22 LAB — HEPATIC FUNCTION PANEL
ALT: 24 U/L (ref 0–53)
AST: 25 U/L (ref 0–37)
Albumin: 4.5 g/dL (ref 3.5–5.2)
Alkaline Phosphatase: 57 U/L (ref 39–117)
Bilirubin, Direct: 0.2 mg/dL (ref 0.0–0.3)
Total Bilirubin: 0.9 mg/dL (ref 0.2–1.2)
Total Protein: 7 g/dL (ref 6.0–8.3)

## 2022-03-22 LAB — CBC WITH DIFFERENTIAL/PLATELET
Basophils Absolute: 0 10*3/uL (ref 0.0–0.1)
Basophils Relative: 0.6 % (ref 0.0–3.0)
Eosinophils Absolute: 0 10*3/uL (ref 0.0–0.7)
Eosinophils Relative: 0.5 % (ref 0.0–5.0)
HCT: 44.4 % (ref 39.0–52.0)
Hemoglobin: 14.8 g/dL (ref 13.0–17.0)
Lymphocytes Relative: 11.2 % — ABNORMAL LOW (ref 12.0–46.0)
Lymphs Abs: 0.7 10*3/uL (ref 0.7–4.0)
MCHC: 33.2 g/dL (ref 30.0–36.0)
MCV: 89.5 fl (ref 78.0–100.0)
Monocytes Absolute: 0.6 10*3/uL (ref 0.1–1.0)
Monocytes Relative: 10 % (ref 3.0–12.0)
Neutro Abs: 5 10*3/uL (ref 1.4–7.7)
Neutrophils Relative %: 77.7 % — ABNORMAL HIGH (ref 43.0–77.0)
Platelets: 234 10*3/uL (ref 150.0–400.0)
RBC: 4.97 Mil/uL (ref 4.22–5.81)
RDW: 13.8 % (ref 11.5–15.5)
WBC: 6.4 10*3/uL (ref 4.0–10.5)

## 2022-03-22 LAB — LIPID PANEL
Cholesterol: 131 mg/dL (ref 0–200)
HDL: 57.9 mg/dL (ref >39.00)
LDL Cholesterol: 63 mg/dL (ref 0–99)
NonHDL: 73.55
Total CHOL/HDL Ratio: 2
Triglycerides: 55 mg/dL (ref 0.0–149.0)
VLDL: 11 mg/dL (ref 0.0–40.0)

## 2022-03-22 LAB — PSA: PSA: 0 ng/mL — ABNORMAL LOW (ref 0.10–4.00)

## 2022-03-22 LAB — VITAMIN B12: Vitamin B-12: 565 pg/mL (ref 211–911)

## 2022-03-22 LAB — HEMOGLOBIN A1C: Hgb A1c MFr Bld: 6.1 % (ref 4.6–6.5)

## 2022-03-22 LAB — TSH: TSH: 1.09 u[IU]/mL (ref 0.35–5.50)

## 2022-03-22 LAB — VITAMIN D 25 HYDROXY (VIT D DEFICIENCY, FRACTURES): VITD: 12.54 ng/mL — ABNORMAL LOW (ref 30.00–100.00)

## 2022-03-22 NOTE — Progress Notes (Unsigned)
Patient ID: Jose Stevens, male   DOB: 1955-11-01, 66 y.o.   MRN: [id number]         Chief Complaint:: wellness exam and Physical (No concerns or questions)  ***       HPI:  Jose Stevens is a 66 y.o. male here for wellness exam                        Also***   Lost several lbs Wt Readings from Last 3 Encounters:  03/22/22 176 lb (79.8 kg)  03/12/21 180 lb (81.6 kg)  11/16/20 180 lb 12.8 oz (82 kg)   BP Readings from Last 3 Encounters:  03/22/22 134/78  03/12/21 (!) 144/82  11/16/20 128/78   Immunization History  Administered Date(s) Administered   Fluad Quad(high Dose 65+) 01/13/2019, 03/09/2020, 03/12/2021   Influenza Split 04/30/2011, 02/13/2012   Influenza Whole 03/30/2009   Influenza,inj,Quad PF,6+ Mos 05/19/2013, 06/06/2015, 02/09/2016, 06/17/2017, 05/28/2018   PFIZER(Purple Top)SARS-COV-2 Vaccination 06/11/2019, 07/02/2019, 03/27/2020   Pneumococcal Conjugate-13 02/18/2019   Pneumococcal Polysaccharide-23 03/12/2021   Tdap 11/12/2020  There are no preventive care reminders to display for this patient.    Past Medical History:  Diagnosis Date   Hemorrhoids    Hyperlipemia    [place] study LDL goal= <160. NMR Lipoprofile 2007: LDL 188 (2618/2018) HDL 38, TG 127. LDL goal= <100, ideally <75   Prostate cancer ([place])    Dr Janice Norrie   Rhinitis    seasonal   Past Surgical History:  Procedure Laterality Date   COLONOSCOPY  2012   negative; [facility]   prostate seed implant  2008    Dr.Nesi   TONSILLECTOMY      reports that he quit smoking about 37 years ago. His smoking use included cigarettes. He has never used smokeless tobacco. He reports current alcohol use of about 4.0 standard drinks of alcohol per week. He reports that he does not use drugs. family history includes Heart attack (age of onset: 38) in his maternal grandfather; Heart attack (age of onset: 52) in his brother; Heart attack (age of onset: 88) in his maternal uncle; Heart disease in his brother;  Hyperlipidemia in his brother; Lung cancer in his mother; Other in his father; Prostate cancer in his brother. No Known Allergies Current Outpatient Medications on File Prior to Visit  Medication Sig Dispense Refill   amoxicillin (AMOXIL) 500 MG capsule Take 1 capsule by mouth 3 times a day until gone 21 capsule 0   aspirin 81 MG tablet Take 81 mg by mouth daily.       azithromycin (ZITHROMAX) 500 MG tablet Take 1 tablet by mouth every day for 3 days. 3 tablet 0   cyclobenzaprine (FLEXERIL) 5 MG tablet Take 1 tablet (5 mg total) by mouth at bedtime as needed for muscle spasms. 7 tablet 0   doxycycline (VIBRA-TABS) 100 MG tablet Take 1 tablet (100 mg total) by mouth 2 (two) times daily. 20 tablet 0   levothyroxine (SYNTHROID) 50 MCG tablet TAKE 1 TABLET BY MOUTH ONCE A DAY 90 tablet 3   loratadine (CLARITIN) 10 MG tablet Take 10 mg by mouth daily as needed.     losartan (COZAAR) 50 MG tablet TAKE 1 TABLET BY MOUTH ONCE A DAY 90 tablet 3   Multiple Vitamin (MULTI VITAMIN DAILY PO) Take by mouth.     naproxen sodium (ALEVE) 220 MG tablet Take 1 tablet (220 mg total) by mouth 2 (two) times daily with  a meal. 30 tablet    omeprazole (PRILOSEC) 20 MG capsule TAKE 1 CAPSULE BY MOUTH DAILY 90 capsule 3   rosuvastatin (CRESTOR) 20 MG tablet TAKE 1 TABLET BY MOUTH DAILY 90 tablet 3   sildenafil (VIAGRA) 100 MG tablet Take 1 tablet (100 mg total) by mouth daily as needed. 10 tablet 11   Azelastine HCl 137 MCG/SPRAY SOLN USE 2 SPRAYS IN EACH NOSTRIL 2 TIMES DAILY 30 mL 5   No current facility-administered medications on file prior to visit.        ROS:  All others reviewed and negative.  Objective        PE:  BP 134/78 (BP Location: Right Arm, Patient Position: Sitting, Cuff Size: Large)   Pulse 93   Temp 98.8 F (37.1 C) (Oral)   Ht '5\' 8"'$  (1.727 m)   Wt 176 lb (79.8 kg)   SpO2 99%   BMI 26.76 kg/m                 Constitutional: Pt appears in NAD               HENT: Head: NCAT.                 Right Ear: External ear normal.                 Left Ear: External ear normal.                Eyes: . Pupils are equal, round, and reactive to light. Conjunctivae and EOM are normal               Nose: without d/c or deformity               Neck: Neck supple. Gross normal ROM               Cardiovascular: Normal rate and regular rhythm.                 Pulmonary/Chest: Effort normal and breath sounds without rales or wheezing.                Abd:  Soft, NT, ND, + BS, no organomegaly               Neurological: Pt is alert. At baseline orientation, motor grossly intact               Skin: Skin is warm. No rashes, no other new lesions, LE edema - ***               Psychiatric: Pt behavior is normal without agitation   Micro: none  Cardiac tracings I have personally interpreted today:  none  Pertinent Radiological findings (summarize): none   Lab Results  Component Value Date   WBC 5.7 03/12/2021   HGB 15.1 03/12/2021   HCT 45.9 03/12/2021   PLT 208.0 03/12/2021   GLUCOSE 89 03/12/2021   CHOL 176 03/12/2021   TRIG 94.0 03/12/2021   HDL 65.50 03/12/2021   LDLDIRECT 160.8 05/06/2007   LDLCALC 91 03/12/2021   ALT 16 03/12/2021   AST 20 03/12/2021   NA 140 03/12/2021   K 4.6 03/12/2021   CL 102 03/12/2021   CREATININE 0.85 03/12/2021   BUN 8 03/12/2021   CO2 31 03/12/2021   TSH 1.35 03/12/2021   PSA 0.00 (L) 03/12/2021   INR 0.9 12/15/2006   HGBA1C 6.0 03/12/2021   Assessment/Plan:  Jose Stevens is a  66 y.o. Black or African American [2] male with  has a past medical history of Hemorrhoids, Hyperlipemia, Prostate cancer ([place]), and Rhinitis.  No problem-specific Assessment & Plan notes found for this encounter.  Followup: No follow-ups on file.  Cathlean Cower, MD 03/22/2022 9:11 AM [facility]

## 2022-03-22 NOTE — Assessment & Plan Note (Signed)
For f/u psa today, no longer follows with urology

## 2022-03-22 NOTE — Patient Instructions (Addendum)
Please have your Shingrix (shingles) shots done at your local pharmacy  You had the flu shot today  You will be contacted regarding the referral for: colonoscopy  Please call if you would want the Cardiac CT score  Please continue all other medications as before, and refills have been done if requested.  Please have the pharmacy call with any other refills you may need.  Please continue your efforts at being more active, low cholesterol diet, and weight control.  You are otherwise up to date with prevention measures today.  Please keep your appointments with your specialists as you may have planned  Please go to the LAB at the blood drawing area for the tests to be done  You will be contacted by phone if any changes need to be made immediately.  Otherwise, you will receive a letter about your results with an explanation, but please check with MyChart first.  Please remember to sign up for MyChart if you have not done so, as this will be important to you in the future with finding out test results, communicating by private email, and scheduling acute appointments online when needed.  Please make an Appointment to return for your 1 year visit, or sooner if needed

## 2022-03-22 NOTE — Assessment & Plan Note (Signed)
Last vitamin D Lab Results  Component Value Date   VD25OH 12.54 (L) 03/22/2022   Low, to start cont oral replacement

## 2022-03-24 ENCOUNTER — Encounter: Payer: Self-pay | Admitting: Internal Medicine

## 2022-03-24 NOTE — Assessment & Plan Note (Signed)
BP Readings from Last 3 Encounters:  03/22/22 134/78  03/12/21 (!) 144/82  11/16/20 128/78   Stable, pt to continue medical treatment losartan 50 mg qd

## 2022-03-24 NOTE — Assessment & Plan Note (Signed)
Lab Results  Component Value Date   LDLCALC 63 03/22/2022   Stable, pt to continue current statin crestor 20 mg qd

## 2022-03-24 NOTE — Assessment & Plan Note (Signed)
Age and sex appropriate education and counseling updated with regular exercise and diet Referrals for preventative services - for colonoscopy Immunizations addressed - for flu shot today, shingrx at pharmacy Smoking counseling  - none needed Evidence for depression or other mood disorder - none significant Most recent labs reviewed. I have personally reviewed and have noted: 1) the patient's medical and social history 2) The patient's current medications and supplements 3) The patient's height, weight, and BMI have been recorded in the chart

## 2022-03-24 NOTE — Assessment & Plan Note (Signed)
Lab Results  Component Value Date   TSH 1.09 03/22/2022   Stable, pt to continue levothyroxine 50 mcg qd

## 2022-03-25 ENCOUNTER — Encounter: Payer: Self-pay | Admitting: Internal Medicine

## 2022-03-27 ENCOUNTER — Encounter: Payer: Self-pay | Admitting: Internal Medicine

## 2022-03-28 NOTE — Telephone Encounter (Signed)
Spoke to pt and pt confirm he was able to get through them and schedule an appointment

## 2022-03-29 ENCOUNTER — Other Ambulatory Visit (HOSPITAL_COMMUNITY): Payer: Self-pay

## 2022-03-29 ENCOUNTER — Other Ambulatory Visit: Payer: Self-pay | Admitting: Internal Medicine

## 2022-03-29 ENCOUNTER — Other Ambulatory Visit: Payer: Self-pay

## 2022-03-29 DIAGNOSIS — Z760 Encounter for issue of repeat prescription: Secondary | ICD-10-CM

## 2022-03-29 MED ORDER — LEVOTHYROXINE SODIUM 50 MCG PO TABS
ORAL_TABLET | Freq: Every day | ORAL | 3 refills | Status: DC
  Filled 2022-03-29: qty 90, 90d supply, fill #0
  Filled 2022-07-04: qty 90, 90d supply, fill #1
  Filled 2022-09-30: qty 90, 90d supply, fill #2
  Filled 2022-12-30: qty 90, 90d supply, fill #3

## 2022-03-29 MED ORDER — OMEPRAZOLE 20 MG PO CPDR
DELAYED_RELEASE_CAPSULE | Freq: Every day | ORAL | 3 refills | Status: DC
  Filled 2022-03-29: qty 90, 90d supply, fill #0
  Filled 2022-07-04: qty 90, 90d supply, fill #1
  Filled 2022-09-30: qty 90, 90d supply, fill #2
  Filled 2022-12-30: qty 90, 90d supply, fill #3

## 2022-03-29 NOTE — Telephone Encounter (Signed)
Please refill as per office routine med refill policy (all routine meds to be refilled for 3 mo or monthly (per pt preference) up to one year from last visit, then month to month grace period for 3 mo, then further med refills will have to be denied) ? ?

## 2022-04-05 ENCOUNTER — Other Ambulatory Visit (HOSPITAL_COMMUNITY): Payer: Self-pay

## 2022-04-09 ENCOUNTER — Ambulatory Visit (AMBULATORY_SURGERY_CENTER): Payer: Self-pay

## 2022-04-09 ENCOUNTER — Other Ambulatory Visit (HOSPITAL_COMMUNITY): Payer: Self-pay

## 2022-04-09 VITALS — Ht 68.0 in | Wt 178.0 lb

## 2022-04-09 DIAGNOSIS — Z1211 Encounter for screening for malignant neoplasm of colon: Secondary | ICD-10-CM

## 2022-04-09 MED ORDER — PEG 3350-KCL-NA BICARB-NACL 420 G PO SOLR
4000.0000 mL | Freq: Once | ORAL | 0 refills | Status: AC
  Filled 2022-04-09: qty 4000, 1d supply, fill #0

## 2022-04-09 NOTE — Progress Notes (Signed)
No egg or soy allergy known to patient;  No issues known to pt with past sedation with any surgeries or procedures; Patient denies ever being told they had issues or difficulty with intubation;  No FH of Malignant Hyperthermia; Pt is not on diet pills; Pt is not on home 02;  Pt is not on blood thinners;  Pt denies issues with constipation  No A fib or A flutter; Have any cardiac testing pending--NO Pt instructed to use Single[employer]com or GoodRx for a price reduction on prep;   Insurance verified during [place] appt=[employer]  Patient's chart reviewed by Osvaldo Angst CNRA prior to previsit and patient appropriate for the [place].  Previsit completed and red dot placed by patient's name on their procedure day (on provider's schedule).

## 2022-04-22 ENCOUNTER — Telehealth: Payer: Self-pay | Admitting: Internal Medicine

## 2022-04-22 ENCOUNTER — Other Ambulatory Visit (HOSPITAL_COMMUNITY): Payer: Self-pay

## 2022-04-22 MED ORDER — AMOXICILLIN 500 MG PO CAPS
500.0000 mg | ORAL_CAPSULE | Freq: Three times a day (TID) | ORAL | 0 refills | Status: DC
  Filled 2022-04-22: qty 21, 7d supply, fill #0

## 2022-04-22 NOTE — Telephone Encounter (Signed)
PT has colonoscopy scheduled for 12/8 but he wants to go to dentist before and wants to know the risks of being on pain meds prior to procedure. Please advise.

## 2022-04-22 NOTE — Telephone Encounter (Signed)
Left message for pt to call back.  Pt called back, he has a bad toothache. Dentist started him on an antibiotic and he wanted to know if that was ok to take due to upcoming procedure. Discussed with pt that the antibiotic was fine to take.

## 2022-04-23 ENCOUNTER — Encounter: Payer: Self-pay | Admitting: Internal Medicine

## 2022-04-26 ENCOUNTER — Ambulatory Visit (AMBULATORY_SURGERY_CENTER): Payer: PPO | Admitting: Internal Medicine

## 2022-04-26 ENCOUNTER — Encounter: Payer: Self-pay | Admitting: Internal Medicine

## 2022-04-26 VITALS — BP 172/98 | HR 70 | Temp 97.5°F | Resp 14 | Ht 68.0 in | Wt 178.0 lb

## 2022-04-26 DIAGNOSIS — D123 Benign neoplasm of transverse colon: Secondary | ICD-10-CM | POA: Diagnosis not present

## 2022-04-26 DIAGNOSIS — E785 Hyperlipidemia, unspecified: Secondary | ICD-10-CM | POA: Diagnosis not present

## 2022-04-26 DIAGNOSIS — Z1211 Encounter for screening for malignant neoplasm of colon: Secondary | ICD-10-CM

## 2022-04-26 DIAGNOSIS — I10 Essential (primary) hypertension: Secondary | ICD-10-CM | POA: Diagnosis not present

## 2022-04-26 MED ORDER — SODIUM CHLORIDE 0.9 % IV SOLN: 500.0000 mL | Freq: Once | INTRAVENOUS | Status: DC

## 2022-04-26 NOTE — Patient Instructions (Signed)
Please read handouts provided. Continue present medications. Await pathology results.   YOU HAD AN ENDOSCOPIC PROCEDURE TODAY AT THE [facility]:   Refer to the procedure report that was given to you for any specific questions about what was found during the examination.  If the procedure report does not answer your questions, please call your gastroenterologist to clarify.  If you requested that your care partner not be given the details of your procedure findings, then the procedure report has been included in a sealed envelope for you to review at your convenience later.  YOU SHOULD EXPECT: Some feelings of bloating in the abdomen. Passage of more gas than usual.  Walking can help get rid of the air that was put into your GI tract during the procedure and reduce the bloating. If you had a lower endoscopy (such as a colonoscopy or flexible sigmoidoscopy) you may notice spotting of blood in your stool or on the toilet paper. If you underwent a bowel prep for your procedure, you may not have a normal bowel movement for a few days.  Please Note:  You might notice some irritation and congestion in your nose or some drainage.  This is from the oxygen used during your procedure.  There is no need for concern and it should clear up in a day or so.  SYMPTOMS TO REPORT IMMEDIATELY:  Following lower endoscopy (colonoscopy or flexible sigmoidoscopy):  Excessive amounts of blood in the stool  Significant tenderness or worsening of abdominal pains  Swelling of the abdomen that is new, acute  Fever of 100F or higher  For urgent or emergent issues, a gastroenterologist can be reached at any hour by calling [phone]. Do not use MyChart messaging for urgent concerns.    DIET:  We do recommend a small meal at first, but then you may proceed to your regular diet.  Drink plenty of fluids but you should avoid alcoholic beverages for 24 hours.  ACTIVITY:  You should plan to take it easy for  the rest of today and you should NOT DRIVE or use heavy machinery until tomorrow (because of the sedation medicines used during the test).    FOLLOW UP: Our staff will call the number listed on your records the next business day following your procedure.  We will call around 7:15- 8:00 am to check on you and address any questions or concerns that you may have regarding the information given to you following your procedure. If we do not reach you, we will leave a message.     If any biopsies were taken you will be contacted by phone or by letter within the next 1-3 weeks.  Please call us at [phone] if you have not heard about the biopsies in 3 weeks.    SIGNATURES/CONFIDENTIALITY: You and/or your care partner have signed paperwork which will be entered into your electronic medical record.  These signatures attest to the fact that that the information above on your After Visit Summary has been reviewed and is understood.  Full responsibility of the confidentiality of this discharge information lies with you and/or your care-partner. 

## 2022-04-26 NOTE — Progress Notes (Signed)
Vitals-AS  Pt's states no medical or surgical changes since previsit or office visit.  

## 2022-04-26 NOTE — Progress Notes (Signed)
Sedate, gd SR, tolerated procedure well, VSS, report to RN 

## 2022-04-26 NOTE — Progress Notes (Signed)
Called to room to assist during endoscopic procedure.  Patient ID and intended procedure confirmed with present staff. Received instructions for my participation in the procedure from the performing physician.  

## 2022-04-26 NOTE — Op Note (Signed)
[name] Procedure Date: 04/26/2022 1:46 PM MRN: [id number] Endoscopist: Docia Chuck. Jose Stevens , MD, [id number] Age: 66 Referring MD:  Date of Birth: 07/16/1955 Gender: Male Account #: [id number] Procedure:                Colonoscopy with cold snare polypectomy x 1 Indications:              Screening for colorectal malignant neoplasm. Index                            exam June 2012 was negative for neoplasia Medicines:                Monitored Anesthesia Care Procedure:                Pre-Anesthesia Assessment:                           - Prior to the procedure, a History and Physical                            was performed, and patient medications and                            allergies were reviewed. The patient's tolerance of                            previous anesthesia was also reviewed. The risks                            and benefits of the procedure and the sedation                            options and risks were discussed with the patient.                            All questions were answered, and informed consent                            was obtained. Prior Anticoagulants: The patient has                            taken no anticoagulant or antiplatelet agents. ASA                            Grade Assessment: II - A patient with mild systemic                            disease. After reviewing the risks and benefits,                            the patient was deemed in satisfactory condition to                            undergo the procedure.  After obtaining informed consent, the colonoscope                            was passed under direct vision. Throughout the                            procedure, the patient's blood pressure, pulse, and                            oxygen saturations were monitored continuously. The                            Olympus PCF-H190DL [id number]) Colonoscope was                             introduced through the anus and advanced to the the                            cecum, identified by appendiceal orifice and                            ileocecal valve. The ileocecal valve, appendiceal                            orifice, and rectum were photographed. The quality                            of the bowel preparation was excellent. The                            colonoscopy was performed without difficulty. The                            patient tolerated the procedure well. The bowel                            preparation used was SUPREP via split dose                            instruction. Scope In: 2:04:18 PM Scope Out: 2:24:38 PM Scope Withdrawal Time: 0 hours 14 minutes 3 seconds  Total Procedure Duration: 0 hours 20 minutes 20 seconds  Findings:                 A 5 mm polyp was found in the transverse colon. The                            polyp was sessile. The polyp was removed with a                            cold snare. Resection and retrieval were complete.                           Multiple diverticula were found in the left colon.  Rectosigmoid stenosis.                           A diffuse area of moderate melanosis was found in                            the entire colon.                           The exam was otherwise without abnormality on                            direct and retroflexion views. Internal hemorrhoids. Complications:            No immediate complications. Estimated blood loss:                            None. Estimated Blood Loss:     Estimated blood loss: none. Impression:               - One 5 mm polyp in the transverse colon, removed                            with a cold snare. Resected and retrieved.                           - Diverticulosis in the left colon.                           - Melanosis in the colon. Internal hemorrhoids.                           - The examination was otherwise normal on  direct                            and retroflexion views. Recommendation:           - Repeat colonoscopy in 7-10 years for surveillance.                           - Patient has a contact number available for                            emergencies. The signs and symptoms of potential                            delayed complications were discussed with the                            patient. Return to normal activities tomorrow.                            Written discharge instructions were provided to the                            patient.                           -  Resume previous diet.                           - Continue present medications.                           - Await pathology results. Docia Chuck. Jose Pastor, MD 04/26/2022 2:31:10 PM This report has been signed electronically.

## 2022-04-26 NOTE — Progress Notes (Signed)
HISTORY OF PRESENT ILLNESS:  Jose Stevens is a 66 y.o. male who presents today for screening colonoscopy.  Index exam June 2012 was negative for neoplasia  REVIEW OF SYSTEMS:  All non-GI ROS negative except for  Past Medical History:  Diagnosis Date   GERD (gastroesophageal reflux disease)    on meds   Hemorrhoids    Hyperlipemia    [place] study LDL goal= <160. NMR Lipoprofile 2007: LDL 188 (2618/2018) HDL 38, TG 127. LDL goal= <100, ideally <75===on meds   Hypertension    on meds   Prostate cancer ([place])    Dr Janice Norrie   Rhinitis    seasonal   Seasonal allergies    Thyroid disease    on meds    Past Surgical History:  Procedure Laterality Date   COLONOSCOPY  05/20/2010   JP-F/V-movi(exc)-tics/HPPx1=recall 10 yrs   prostate seed implant  05/20/2006   Dr.Nesi-seed implantation (prostate CA)   TONSILLECTOMY      Social History [facility]  reports that he quit smoking about 37 years ago. His smoking use included cigarettes. He has never used smokeless tobacco. He reports that he does not currently use alcohol. He reports that he does not use drugs.  family history includes Heart attack (age of onset: 81) in his maternal grandfather; Heart attack (age of onset: 88) in his brother; Heart attack (age of onset: 53) in his maternal uncle; Heart disease in his brother; Hyperlipidemia in his brother; Lung cancer in his mother; Other in his father; Prostate cancer in his brother.  Not on File     PHYSICAL EXAMINATION: Vital signs: BP 136/81 (BP Location: Left Arm, Patient Position: Sitting, Cuff Size: Normal)   Pulse 75   Temp (!) 97.5 F (36.4 C) (Temporal)   Ht '5\' 8"'$  (1.727 m)   Wt 178 lb (80.7 kg)   SpO2 99%   BMI 27.06 kg/m  General: Well-developed, well-nourished, no acute distress HEENT: Sclerae are anicteric, conjunctiva pink. Oral mucosa intact Lungs: Clear Heart: Regular Abdomen: soft, nontender, nondistended, no obvious ascites, no peritoneal signs,  normal bowel sounds. No organomegaly. Extremities: No edema Psychiatric: alert and oriented x3. Cooperative     ASSESSMENT:   Colon cancer screening  PLAN:  Screening colonoscopy

## 2022-04-29 ENCOUNTER — Telehealth: Payer: Self-pay

## 2022-04-29 NOTE — Telephone Encounter (Signed)
Follow up call placed, VM obtained and message left. 

## 2022-04-30 ENCOUNTER — Encounter: Payer: Self-pay | Admitting: Internal Medicine

## 2022-05-08 ENCOUNTER — Other Ambulatory Visit (HOSPITAL_COMMUNITY): Payer: Self-pay

## 2022-05-08 MED ORDER — AMOXICILLIN 500 MG PO CAPS
500.0000 mg | ORAL_CAPSULE | Freq: Three times a day (TID) | ORAL | 0 refills | Status: DC
  Filled 2022-05-08: qty 21, 7d supply, fill #0

## 2022-05-08 MED ORDER — IBUPROFEN 800 MG PO TABS
800.0000 mg | ORAL_TABLET | Freq: Four times a day (QID) | ORAL | 2 refills | Status: AC | PRN
  Filled 2022-05-08: qty 20, 5d supply, fill #0

## 2022-07-04 ENCOUNTER — Other Ambulatory Visit: Payer: Self-pay | Admitting: Internal Medicine

## 2022-07-04 ENCOUNTER — Other Ambulatory Visit (HOSPITAL_COMMUNITY): Payer: Self-pay

## 2022-07-04 MED ORDER — LOSARTAN POTASSIUM 50 MG PO TABS
50.0000 mg | ORAL_TABLET | Freq: Every day | ORAL | 3 refills | Status: DC
  Filled 2022-07-04: qty 90, 90d supply, fill #0
  Filled 2022-09-30: qty 90, 90d supply, fill #1
  Filled 2022-12-30: qty 90, 90d supply, fill #2
  Filled 2023-04-02: qty 90, 90d supply, fill #3

## 2022-07-04 MED ORDER — ROSUVASTATIN CALCIUM 20 MG PO TABS
20.0000 mg | ORAL_TABLET | Freq: Every day | ORAL | 2 refills | Status: DC
  Filled 2022-07-04: qty 90, 90d supply, fill #0
  Filled 2023-04-02: qty 90, 90d supply, fill #1

## 2022-07-04 NOTE — Telephone Encounter (Signed)
Please refill as per office routine med refill policy (all routine meds to be refilled for 3 mo or monthly (per pt preference) up to one year from last visit, then month to month grace period for 3 mo, then further med refills will have to be denied) ? ?

## 2022-07-05 ENCOUNTER — Other Ambulatory Visit (HOSPITAL_COMMUNITY): Payer: Self-pay

## 2022-07-30 ENCOUNTER — Other Ambulatory Visit (HOSPITAL_COMMUNITY): Payer: Self-pay

## 2022-09-30 ENCOUNTER — Other Ambulatory Visit (HOSPITAL_COMMUNITY): Payer: Self-pay

## 2022-12-06 ENCOUNTER — Encounter: Payer: Self-pay | Admitting: *Deleted

## 2022-12-06 ENCOUNTER — Telehealth: Payer: Self-pay | Admitting: *Deleted

## 2022-12-06 NOTE — Telephone Encounter (Signed)
Pt returned call. Scheduled for November annual exam. Pt has no transportation issues getting to this appointment at this time.

## 2022-12-06 NOTE — Telephone Encounter (Signed)
I attempted to contact patient by telephone but was unsuccessful. According to the patient's chart they are due for annual exam in NOV with [facility]. I have left a HIPAA compliant message advising the patient to contact [facility] at [phone]. I will continue to follow up with the patient to make sure this appointment is scheduled.

## 2022-12-25 ENCOUNTER — Encounter: Payer: Self-pay | Admitting: Internal Medicine

## 2022-12-25 NOTE — Telephone Encounter (Signed)
Ok to have pt make ROV with [place] here first

## 2022-12-30 ENCOUNTER — Other Ambulatory Visit (HOSPITAL_COMMUNITY): Payer: Self-pay

## 2023-01-02 ENCOUNTER — Other Ambulatory Visit (HOSPITAL_COMMUNITY): Payer: Self-pay

## 2023-01-07 ENCOUNTER — Ambulatory Visit: Payer: PPO | Admitting: Internal Medicine

## 2023-01-15 ENCOUNTER — Other Ambulatory Visit (HOSPITAL_COMMUNITY): Payer: Self-pay

## 2023-01-15 ENCOUNTER — Encounter: Payer: Self-pay | Admitting: Internal Medicine

## 2023-01-15 ENCOUNTER — Ambulatory Visit: Payer: PPO | Admitting: Internal Medicine

## 2023-01-15 VITALS — BP 120/72 | HR 82 | Temp 98.7°F | Ht 68.0 in | Wt 173.0 lb

## 2023-01-15 DIAGNOSIS — E039 Hypothyroidism, unspecified: Secondary | ICD-10-CM

## 2023-01-15 DIAGNOSIS — Z125 Encounter for screening for malignant neoplasm of prostate: Secondary | ICD-10-CM | POA: Diagnosis not present

## 2023-01-15 DIAGNOSIS — E559 Vitamin D deficiency, unspecified: Secondary | ICD-10-CM | POA: Diagnosis not present

## 2023-01-15 DIAGNOSIS — E538 Deficiency of other specified B group vitamins: Secondary | ICD-10-CM

## 2023-01-15 DIAGNOSIS — R739 Hyperglycemia, unspecified: Secondary | ICD-10-CM | POA: Diagnosis not present

## 2023-01-15 DIAGNOSIS — N401 Enlarged prostate with lower urinary tract symptoms: Secondary | ICD-10-CM | POA: Diagnosis not present

## 2023-01-15 DIAGNOSIS — I10 Essential (primary) hypertension: Secondary | ICD-10-CM

## 2023-01-15 DIAGNOSIS — R9431 Abnormal electrocardiogram [ECG] [EKG]: Secondary | ICD-10-CM

## 2023-01-15 DIAGNOSIS — Z0000 Encounter for general adult medical examination without abnormal findings: Secondary | ICD-10-CM | POA: Diagnosis not present

## 2023-01-15 DIAGNOSIS — R3912 Poor urinary stream: Secondary | ICD-10-CM | POA: Diagnosis not present

## 2023-01-15 DIAGNOSIS — Z23 Encounter for immunization: Secondary | ICD-10-CM | POA: Diagnosis not present

## 2023-01-15 DIAGNOSIS — E785 Hyperlipidemia, unspecified: Secondary | ICD-10-CM | POA: Diagnosis not present

## 2023-01-15 DIAGNOSIS — Z0001 Encounter for general adult medical examination with abnormal findings: Secondary | ICD-10-CM

## 2023-01-15 MED ORDER — TADALAFIL 5 MG PO TABS
5.0000 mg | ORAL_TABLET | Freq: Every day | ORAL | 11 refills | Status: DC
  Filled 2023-01-15: qty 30, 30d supply, fill #0
  Filled 2023-04-02: qty 30, 30d supply, fill #1
  Filled 2023-05-05 (×2): qty 30, 30d supply, fill #2
  Filled 2023-06-03: qty 30, 30d supply, fill #3
  Filled 2023-07-03: qty 30, 30d supply, fill #4
  Filled 2023-07-29: qty 30, 30d supply, fill #5
  Filled 2023-09-03: qty 30, 30d supply, fill #6
  Filled 2023-10-02: qty 30, 30d supply, fill #7
  Filled 2023-12-29: qty 30, 30d supply, fill #8

## 2023-01-15 NOTE — Patient Instructions (Addendum)
Please have your Shingrix (shingles) shots done at your local pharmacy.  You had the flu shot today  You will be contacted regarding the referral for: Cardiac CT score  Please take all new medication as prescribed - the cialis 5 mg daily  Please continue all other medications as before, and refills have been done if requested.  Please have the pharmacy call with any other refills you may need.  Please continue your efforts at being more active, low cholesterol diet, and weight control.  Please keep your appointments with your specialists as you may have planned  Please make an Appointment to return in Nov 5, or sooner if needed, also with Labs done at the [facility] lab about a week ahead

## 2023-01-15 NOTE — Progress Notes (Signed)
Patient ID: Jose Stevens, male   DOB: 1956/05/11, 67 y.o.   MRN: [id number]         Chief Complaint:: wellness exam and bph, hld, low thyroid, low vit d      HPI:  Jose Stevens is a 67 y.o. 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   Pt denies chest pain, increased sob or doe, wheezing, orthopnea, PND, increased LE swelling, palpitations, dizziness or syncope.   Pt denies polydipsia, polyuria, or new focal neuro s/s.    Pt denies fever, wt loss, night sweats, loss of appetite, or other constitutional symptoms  Does have recurring problem with weaker urinary stream and frequency, some nocturia as well.  Does c/o ongoing fatigue, but denies signficant daytime hypersomnolence. Denies hyper or hypo thyroid symptoms such as voice, skin or hair change   Wt Readings from Last 3 Encounters:  01/15/23 173 lb (78.5 kg)  04/26/22 178 lb (80.7 kg)  04/09/22 178 lb (80.7 kg)   BP Readings from Last 3 Encounters:  01/15/23 120/72  04/26/22 (!) 172/98  03/22/22 134/78   Immunization History  Administered Date(s) Administered   Fluad Quad(high Dose 65+) 01/13/2019, 03/09/2020, 03/12/2021, 03/22/2022   Fluad Trivalent(High Dose 65+) 01/15/2023   Influenza Split 04/30/2011, 02/13/2012   Influenza Whole 03/30/2009   Influenza,inj,Quad PF,6+ Mos 05/19/2013, 06/06/2015, 02/09/2016, 06/17/2017, 05/28/2018   PFIZER(Purple Top)SARS-COV-2 Vaccination 06/11/2019, 07/02/2019, 03/27/2020   Pneumococcal Conjugate-13 02/18/2019   Pneumococcal Polysaccharide-23 03/12/2021   Tdap 11/12/2020   Health Maintenance Due  Topic Date Due   Medicare Annual Wellness (AWV)  Never done   Zoster Vaccines- Shingrix (1 of 2) Never done   COVID-19 Vaccine (4 - 2023-24 season) 01/18/2022      Past Medical History:  Diagnosis Date    GERD (gastroesophageal reflux disease)    on meds   Hemorrhoids    Hyperlipemia    [place] study LDL goal= <160. NMR Lipoprofile 2007: LDL 188 (2618/2018) HDL 38, TG 127. LDL goal= <100, ideally <75===on meds   Hypertension    on meds   Prostate cancer ([facility])    Dr Brunilda Payor   Rhinitis    seasonal   Seasonal allergies    Thyroid disease    on meds   Past Surgical History:  Procedure Laterality Date   COLONOSCOPY  05/20/2010   JP-F/V-movi(exc)-tics/HPPx1=recall 10 yrs   prostate seed implant  05/20/2006   Dr.Nesi-seed implantation (prostate CA)   TONSILLECTOMY      reports that he quit smoking about 38 years ago. His smoking use included cigarettes. He has never used smokeless tobacco. He reports that he does not currently use alcohol. He reports that he does not use drugs. family history includes Heart attack (age of onset: 65) in his maternal grandfather; Heart attack (age of onset: 65) in his brother; Heart attack (age of onset: 3) in his maternal uncle; Heart disease in his brother; Hyperlipidemia in his brother; Lung cancer in his mother; Other in his father; Prostate cancer in his brother. Not on File Current Outpatient Medications on File Prior to Visit  Medication Sig Dispense Refill   aspirin 81 MG tablet Take 81 mg by mouth daily.  cyclobenzaprine (FLEXERIL) 5 MG tablet Take 1 tablet (5 mg total) by mouth at bedtime as needed for muscle spasms. 7 tablet 0   ibuprofen (ADVIL) 800 MG tablet Take 1 tablet (800 mg total) by mouth every 6 (six) hours as needed. 20 tablet 2   levothyroxine (SYNTHROID) 50 MCG tablet TAKE 1 TABLET BY MOUTH ONCE A DAY 90 tablet 3   loratadine (CLARITIN) 10 MG tablet Take 10 mg by mouth daily as needed.     losartan (COZAAR) 50 MG tablet Take 1 tablet by mouth daily. 90 tablet 3   Multiple Vitamin (MULTI VITAMIN DAILY PO) Take 1 tablet by mouth daily as needed.     naproxen sodium (ALEVE) 220 MG tablet Take 1 tablet (220 mg total) by mouth 2 (two)  times daily with a meal. (Patient taking differently: Take 220 mg by mouth 2 (two) times daily as needed.) 30 tablet    omeprazole (PRILOSEC) 20 MG capsule TAKE 1 CAPSULE BY MOUTH DAILY 90 capsule 3   rosuvastatin (CRESTOR) 20 MG tablet Take 1 tablet by mouth daily. 90 tablet 2   sildenafil (VIAGRA) 100 MG tablet Take 1 tablet (100 mg total) by mouth daily as needed. 10 tablet 11   amoxicillin (AMOXIL) 500 MG capsule Take 1 capsule (500 mg total) by mouth 3 (three) times daily until gone. (Patient not taking: Reported on 01/15/2023) 21 capsule 0   Azelastine HCl 137 MCG/SPRAY SOLN USE 2 SPRAYS IN EACH NOSTRIL 2 TIMES DAILY (Patient taking differently: Place 1 spray into both nostrils daily as needed.) 30 mL 5   No current facility-administered medications on file prior to visit.        ROS:  All others reviewed and negative.  Objective        PE:  BP 120/72 (BP Location: Right Arm, Patient Position: Sitting, Cuff Size: Normal)   Pulse 82   Temp 98.7 F (37.1 C) (Oral)   Ht 5\' 8"  (1.727 m)   Wt 173 lb (78.5 kg)   SpO2 98%   BMI 26.30 kg/m                 Constitutional: Pt appears in NAD               HENT: Head: NCAT.                Right Ear: External ear normal.                 Left Ear: External ear normal.                Eyes: . Pupils are equal, round, and reactive to light. Conjunctivae and EOM are normal               Nose: without d/c or deformity               Neck: Neck supple. Gross normal ROM               Cardiovascular: Normal rate and regular rhythm.                 Pulmonary/Chest: Effort normal and breath sounds without rales or wheezing.                Abd:  Soft, NT, ND, + BS, no organomegaly               Neurological: Pt is alert. At baseline orientation, motor grossly intact  Skin: Skin is warm. No rashes, no other new lesions, LE edema - none               Psychiatric: Pt behavior is normal without agitation   Micro: none  Cardiac tracings I  have personally interpreted today:  none  Pertinent Radiological findings (summarize): none   Lab Results  Component Value Date   WBC 6.4 03/22/2022   HGB 14.8 03/22/2022   HCT 44.4 03/22/2022   PLT 234.0 03/22/2022   GLUCOSE 83 03/22/2022   CHOL 131 03/22/2022   TRIG 55.0 03/22/2022   HDL 57.90 03/22/2022   LDLDIRECT 160.8 05/06/2007   LDLCALC 63 03/22/2022   ALT 24 03/22/2022   AST 25 03/22/2022   NA 139 03/22/2022   K 4.4 03/22/2022   CL 101 03/22/2022   CREATININE 0.83 03/22/2022   BUN 10 03/22/2022   CO2 33 (H) 03/22/2022   TSH 1.09 03/22/2022   PSA 0.00 (L) 03/22/2022   INR 0.9 12/15/2006   HGBA1C 6.1 03/22/2022   Assessment/Plan:  BAPTISTE ODENS is a 67 y.o. Black or African American [2] male with  has a past medical history of GERD (gastroesophageal reflux disease), Hemorrhoids, Hyperlipemia, Hypertension, Prostate cancer ([facility]), Rhinitis, Seasonal allergies, and Thyroid disease.  Encounter for well adult exam with abnormal findings Age and sex appropriate education and counseling updated with regular exercise and diet Referrals for preventative services - none needed Immunizations addressed - declines covid booster, for shingrix at pharmacy, for flu shot today Smoking counseling  - none needed Evidence for depression or other mood disorder - none significant Most recent labs reviewed. I have personally reviewed and have noted: 1) the patient's medical and social history 2) The patient's current medications and supplements 3) The patient's height, weight, and BMI have been recorded in the chart   BPH (benign prostatic hyperplasia) Mild to mod, for cialis 5 mg every day,  to f/u any worsening symptoms or concerns   Essential hypertension BP Readings from Last 3 Encounters:  01/15/23 120/72  04/26/22 (!) 172/98  03/22/22 134/78   Stable, pt to continue medical treatment losartan 50 mg qd   Hyperlipidemia Lab Results  Component Value Date   LDLCALC  63 03/22/2022   Stable, pt to continue current statin crestor 20 mg every day, also for cardiac CT score  Hypothyroidism Lab Results  Component Value Date   TSH 1.09 03/22/2022   Stable, pt to continue levothyroxine 50 mcg qd   Vitamin D deficiency Last vitamin D Lab Results  Component Value Date   VD25OH 12.54 (L) 03/22/2022   Low, to start oral replacement  Followup: Return in about 2 months (around 03/25/2023).  Oliver Barre, MD 01/18/2023 1:16 PM [facility]

## 2023-01-16 ENCOUNTER — Other Ambulatory Visit (HOSPITAL_COMMUNITY): Payer: Self-pay

## 2023-01-18 ENCOUNTER — Encounter: Payer: Self-pay | Admitting: Internal Medicine

## 2023-01-18 DIAGNOSIS — N400 Enlarged prostate without lower urinary tract symptoms: Secondary | ICD-10-CM | POA: Insufficient documentation

## 2023-01-18 NOTE — Assessment & Plan Note (Signed)
Lab Results  Component Value Date   TSH 1.09 03/22/2022   Stable, pt to continue levothyroxine 50 mcg qd

## 2023-01-18 NOTE — Assessment & Plan Note (Signed)
Last vitamin D Lab Results  Component Value Date   VD25OH 12.54 (L) 03/22/2022   Low, to start oral replacement

## 2023-01-18 NOTE — Assessment & Plan Note (Signed)
BP Readings from Last 3 Encounters:  01/15/23 120/72  04/26/22 (!) 172/98  03/22/22 134/78   Stable, pt to continue medical treatment losartan 50 mg qd

## 2023-01-18 NOTE — Assessment & Plan Note (Addendum)
Lab Results  Component Value Date   LDLCALC 63 03/22/2022   Stable, pt to continue current statin crestor 20 mg every day, also for cardiac CT score

## 2023-01-18 NOTE — Assessment & Plan Note (Signed)
Mild to mod, for cialis 5 mg every day,  to f/u any worsening symptoms or concerns

## 2023-01-18 NOTE — Assessment & Plan Note (Addendum)
Age and sex appropriate education and counseling updated with regular exercise and diet Referrals for preventative services - none needed Immunizations addressed - declines covid booster, for shingrix at pharmacy, for flu shot today Smoking counseling  - none needed Evidence for depression or other mood disorder - none significant Most recent labs reviewed. I have personally reviewed and have noted: 1) the patient's medical and social history 2) The patient's current medications and supplements 3) The patient's height, weight, and BMI have been recorded in the chart

## 2023-01-27 ENCOUNTER — Ambulatory Visit (HOSPITAL_BASED_OUTPATIENT_CLINIC_OR_DEPARTMENT_OTHER)
Admission: RE | Admit: 2023-01-27 | Discharge: 2023-01-27 | Disposition: A | Discharge status: Home / Self Care | Payer: PPO | Source: Ambulatory Visit | Attending: Internal Medicine | Admitting: Internal Medicine

## 2023-01-27 ENCOUNTER — Other Ambulatory Visit: Payer: Self-pay | Admitting: Internal Medicine

## 2023-01-27 DIAGNOSIS — R9431 Abnormal electrocardiogram [ECG] [EKG]: Secondary | ICD-10-CM | POA: Insufficient documentation

## 2023-01-27 DIAGNOSIS — R739 Hyperglycemia, unspecified: Secondary | ICD-10-CM | POA: Insufficient documentation

## 2023-01-27 DIAGNOSIS — I10 Essential (primary) hypertension: Secondary | ICD-10-CM | POA: Insufficient documentation

## 2023-01-27 DIAGNOSIS — R931 Abnormal findings on diagnostic imaging of heart and coronary circulation: Secondary | ICD-10-CM

## 2023-01-28 ENCOUNTER — Encounter: Payer: Self-pay | Admitting: Internal Medicine

## 2023-01-28 ENCOUNTER — Ambulatory Visit (INDEPENDENT_AMBULATORY_CARE_PROVIDER_SITE_OTHER): Payer: PPO | Admitting: Internal Medicine

## 2023-01-28 ENCOUNTER — Other Ambulatory Visit (HOSPITAL_COMMUNITY): Payer: Self-pay

## 2023-01-28 VITALS — BP 130/82 | HR 70 | Temp 98.2°F | Ht 68.0 in | Wt 178.0 lb

## 2023-01-28 DIAGNOSIS — J069 Acute upper respiratory infection, unspecified: Secondary | ICD-10-CM

## 2023-01-28 DIAGNOSIS — E559 Vitamin D deficiency, unspecified: Secondary | ICD-10-CM | POA: Diagnosis not present

## 2023-01-28 DIAGNOSIS — E785 Hyperlipidemia, unspecified: Secondary | ICD-10-CM

## 2023-01-28 DIAGNOSIS — I10 Essential (primary) hypertension: Secondary | ICD-10-CM

## 2023-01-28 DIAGNOSIS — R931 Abnormal findings on diagnostic imaging of heart and coronary circulation: Secondary | ICD-10-CM | POA: Diagnosis not present

## 2023-01-28 MED ORDER — AZITHROMYCIN 250 MG PO TABS
ORAL_TABLET | ORAL | 1 refills | Status: AC
  Filled 2023-01-28: qty 6, 5d supply, fill #0

## 2023-01-28 NOTE — Assessment & Plan Note (Signed)
BP Readings from Last 3 Encounters:  01/28/23 130/82  01/15/23 120/72  04/26/22 (!) 172/98   Stable, pt to continue medical treatment losartan 50 mg qd

## 2023-01-28 NOTE — Progress Notes (Signed)
Patient ID: Jose Stevens, male   DOB: 10/28/1955, 67 y.o.   MRN: [id number]        Chief Complaint: follow up acute URI symptoms, elevated Coronary Calcium score, hld, htn, low vit d       HPI:  Jose Stevens is a 67 y.o. male  Here with 2-3 days acute onset fever, facial pain, pressure, headache, general weakness and malaise, and greenish d/c, with mild ST and cough, but pt denies chest pain, wheezing, increased sob or doe, orthopnea, PND, increased LE swelling, palpitations, dizziness or syncope.   Pt denies polydipsia, polyuria, or new focal neuro s/s.    Pt denies fever, wt loss, night sweats, loss of appetite, or other constitutional symptoms         Wt Readings from Last 3 Encounters:  01/28/23 178 lb (80.7 kg)  01/15/23 173 lb (78.5 kg)  04/26/22 178 lb (80.7 kg)   BP Readings from Last 3 Encounters:  01/28/23 130/82  01/15/23 120/72  04/26/22 (!) 172/98         Past Medical History:  Diagnosis Date   GERD (gastroesophageal reflux disease)    on meds   Hemorrhoids    Hyperlipemia    [place] study LDL goal= <160. NMR Lipoprofile 2007: LDL 188 (2618/2018) HDL 38, TG 127. LDL goal= <100, ideally <75===on meds   Hypertension    on meds   Prostate cancer (HCC)    Dr Brunilda Payor   Rhinitis    seasonal   Seasonal allergies    Thyroid disease    on meds   Past Surgical History:  Procedure Laterality Date   COLONOSCOPY  05/20/2010   JP-F/V-movi(exc)-tics/HPPx1=recall 10 yrs   prostate seed implant  05/20/2006   Dr.Nesi-seed implantation (prostate CA)   TONSILLECTOMY      reports that he quit smoking about 38 years ago. His smoking use included cigarettes. He has never used smokeless tobacco. He reports that he does not currently use alcohol. He reports that he does not use drugs. family history includes Heart attack (age of onset: 30) in his maternal grandfather; Heart attack (age of onset: 29) in his brother; Heart attack (age of onset: 69) in his maternal uncle; Heart  disease in his brother; Hyperlipidemia in his brother; Lung cancer in his mother; Other in his father; Prostate cancer in his brother. Not on File Current Outpatient Medications on File Prior to Visit  Medication Sig Dispense Refill   aspirin 81 MG tablet Take 81 mg by mouth daily.     cyclobenzaprine (FLEXERIL) 5 MG tablet Take 1 tablet (5 mg total) by mouth at bedtime as needed for muscle spasms. 7 tablet 0   ibuprofen (ADVIL) 800 MG tablet Take 1 tablet (800 mg total) by mouth every 6 (six) hours as needed. 20 tablet 2   levothyroxine (SYNTHROID) 50 MCG tablet TAKE 1 TABLET BY MOUTH ONCE A DAY 90 tablet 3   loratadine (CLARITIN) 10 MG tablet Take 10 mg by mouth daily as needed.     losartan (COZAAR) 50 MG tablet Take 1 tablet by mouth daily. 90 tablet 3   Multiple Vitamin (MULTI VITAMIN DAILY PO) Take 1 tablet by mouth daily as needed.     naproxen sodium (ALEVE) 220 MG tablet Take 1 tablet (220 mg total) by mouth 2 (two) times daily with a meal. (Patient taking differently: Take 220 mg by mouth 2 (two) times daily as needed.) 30 tablet    omeprazole (PRILOSEC) 20 MG capsule TAKE  1 CAPSULE BY MOUTH DAILY 90 capsule 3   rosuvastatin (CRESTOR) 20 MG tablet Take 1 tablet by mouth daily. 90 tablet 2   sildenafil (VIAGRA) 100 MG tablet Take 1 tablet (100 mg total) by mouth daily as needed. 10 tablet 11   tadalafil (CIALIS) 5 MG tablet Take 1 tablet (5 mg total) by mouth daily. 30 tablet 11   amoxicillin (AMOXIL) 500 MG capsule Take 1 capsule (500 mg total) by mouth 3 (three) times daily until gone. (Patient not taking: Reported on 01/15/2023) 21 capsule 0   Azelastine HCl 137 MCG/SPRAY SOLN USE 2 SPRAYS IN EACH NOSTRIL 2 TIMES DAILY (Patient taking differently: Place 1 spray into both nostrils daily as needed.) 30 mL 5   No current facility-administered medications on file prior to visit.        ROS:  All others reviewed and negative.  Objective        PE:  BP 130/82 (BP Location: Right Arm,  Patient Position: Sitting, Cuff Size: Normal)   Pulse 70   Temp 98.2 F (36.8 C) (Oral)   Ht 5\' 8"  (1.727 m)   Wt 178 lb (80.7 kg)   SpO2 98%   BMI 27.06 kg/m                 Constitutional: Pt appears in NAD               HENT: Head: NCAT.                Right Ear: External ear normal.                 Left Ear: External ear normal. Bilat tm's with mild erythema.  Max sinus areas non tender.  Pharynx with mild erythema, no exudate               Eyes: . Pupils are equal, round, and reactive to light. Conjunctivae and EOM are normal               Nose: without d/c or deformity               Neck: Neck supple. Gross normal ROM               Cardiovascular: Normal rate and regular rhythm.                 Pulmonary/Chest: Effort normal and breath sounds without rales or wheezing.                Abd:  Soft, NT, ND, + BS, no organomegaly               Neurological: Pt is alert. At baseline orientation, motor grossly intact               Skin: Skin is warm. No rashes, no other new lesions, LE edema - none               Psychiatric: Pt behavior is normal without agitation   Micro: none  Cardiac tracings I have personally interpreted today:  none  Pertinent Radiological findings (summarize): none   Lab Results  Component Value Date   WBC 6.4 03/22/2022   HGB 14.8 03/22/2022   HCT 44.4 03/22/2022   PLT 234.0 03/22/2022   GLUCOSE 83 03/22/2022   CHOL 131 03/22/2022   TRIG 55.0 03/22/2022   HDL 57.90 03/22/2022   LDLDIRECT 160.8 05/06/2007   LDLCALC 63 03/22/2022   ALT 24 03/22/2022  AST 25 03/22/2022   NA 139 03/22/2022   K 4.4 03/22/2022   CL 101 03/22/2022   CREATININE 0.83 03/22/2022   BUN 10 03/22/2022   CO2 33 (H) 03/22/2022   TSH 1.09 03/22/2022   PSA 0.00 (L) 03/22/2022   INR 0.9 12/15/2006   HGBA1C 6.1 03/22/2022   Assessment/Plan:  Jose Stevens is a 67 y.o. Black or African American [2] male with  has a past medical history of GERD (gastroesophageal reflux  disease), Hemorrhoids, Hyperlipemia, Hypertension, Prostate cancer (HCC), Rhinitis, Seasonal allergies, and Thyroid disease.  Acute upper respiratory infection Mild to mod, for antibx course, zpack asd, to f/u any worsening symptoms or concerns  Elevated coronary artery calcium score Mod elevated - for cardiology referral  Essential hypertension BP Readings from Last 3 Encounters:  01/28/23 130/82  01/15/23 120/72  04/26/22 (!) 172/98   Stable, pt to continue medical treatment losartan 50 mg qd   Hyperlipidemia Lab Results  Component Value Date   LDLCALC 63 03/22/2022   Stable, pt to continue current statin crestor 20 qd   Vitamin D deficiency Last vitamin D Lab Results  Component Value Date   VD25OH 12.54 (L) 03/22/2022   Low, to start oral replacement  Followup: Return in about 8 weeks (around 03/25/2023).  Oliver Barre, MD 01/28/2023 9:43 PM [facility]  Primary Care - [facility]

## 2023-01-28 NOTE — Assessment & Plan Note (Signed)
Mild to mod, for antibx course, zpack asd, to f/u any worsening symptoms or concerns

## 2023-01-28 NOTE — Patient Instructions (Signed)
Please take all new medication as prescribed - the antibiotic  Please continue all other medications as before, and refills have been done if requested.  Please have the pharmacy call with any other refills you may need.  Please continue your efforts at being more active, low cholesterol diet, and weight control.  Please keep your appointments with your specialists as you may have planned  You will be contacted regarding the referral for: Cardiology

## 2023-01-28 NOTE — Assessment & Plan Note (Signed)
Last vitamin D Lab Results  Component Value Date   VD25OH 12.54 (L) 03/22/2022   Low, to start oral replacement

## 2023-01-28 NOTE — Assessment & Plan Note (Signed)
Mod elevated - for cardiology referral

## 2023-01-28 NOTE — Assessment & Plan Note (Signed)
Lab Results  Component Value Date   LDLCALC 63 03/22/2022   Stable, pt to continue current statin crestor 20 qd

## 2023-02-07 ENCOUNTER — Other Ambulatory Visit: Payer: Self-pay | Admitting: Internal Medicine

## 2023-02-07 DIAGNOSIS — R931 Abnormal findings on diagnostic imaging of heart and coronary circulation: Secondary | ICD-10-CM

## 2023-02-12 ENCOUNTER — Encounter (HOSPITAL_BASED_OUTPATIENT_CLINIC_OR_DEPARTMENT_OTHER): Payer: Self-pay | Admitting: Nurse Practitioner

## 2023-02-12 ENCOUNTER — Ambulatory Visit (HOSPITAL_BASED_OUTPATIENT_CLINIC_OR_DEPARTMENT_OTHER): Payer: PPO | Admitting: Nurse Practitioner

## 2023-02-12 VITALS — BP 126/68 | HR 77 | Ht 68.0 in | Wt 173.9 lb

## 2023-02-12 DIAGNOSIS — R071 Chest pain on breathing: Secondary | ICD-10-CM | POA: Diagnosis not present

## 2023-02-12 DIAGNOSIS — R931 Abnormal findings on diagnostic imaging of heart and coronary circulation: Secondary | ICD-10-CM

## 2023-02-12 DIAGNOSIS — I10 Essential (primary) hypertension: Secondary | ICD-10-CM | POA: Diagnosis not present

## 2023-02-12 NOTE — Progress Notes (Signed)
Cardiology Office Note:  .   Date:  02/12/2023 ID:  Jose Stevens, DOB [birth year], MRN [id number] PCP: Jose Levins, MD [facility] Providers Cardiologist:  None   Patient Profile: .      PMH Coronary artery disease CT calcium score 775 (79th percentile) completed 01/27/23 Negative ETT 2008 Hyperlipidemia Hypertension Family history CAD Fathers and brothers with prior MIs Prostate cancer diagnosed in 2008, cancer free        History of Present Illness: .   Jose Stevens is a very pleasant 67 y.o. male  who is here today for new patient consult for elevated coronary calcium score. He previously underwent exercise stress test in 2008 which was negative. Has been on statin since approximately 2013. 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  He has been working to lose weight and weighs the least he has ever weighed. Wanted to decrease his overall body fat. Does a cleanse ever Monday of water with lemon juice and cayenne pepper. Family history of father and brothers with previous MI. He is more active and weighs considerably less than these family members. His youngest brother is a physician and also has an elevated calcium score, has not had an MI. Admits he has not been entirely consistent with rosuvastatin daily. Is currently taking every other day due to soreness, has missed doses as much as a week's worth.  He denies concerning cardiac symptoms including chest pain, palpitations, shortness of breath, dyspnea, orthopnea, PND, edema, presyncope Stevens syncope.   Family history: His family history includes Heart attack (age of onset: 44) in his maternal grandfather; Heart attack (age of onset: 49) in his brother; Heart attack (age of onset: 34) in his maternal uncle; Heart disease in his brother; Hyperlipidemia in his brother; Lung cancer in his mother; Other in his father; Prostate cancer in his brother.   ASCVD Risk  Score: The 10-year ASCVD risk score (Arnett DK, et al., 2019) is: 15.3%   Values used to calculate the score:     Age: 37 years     Sex: Male     Is Non-Hispanic African American: Yes     Diabetic: No     Tobacco smoker: No     Systolic Blood Pressure: 126 mmHg     Is BP treated: Yes     HDL Cholesterol: 57.9 mg/dL     Total Cholesterol: 131 mg/dL    Diet: "not a big eater" Breakfast: Oatmeal, black coffee, likes to have a pastry with his black coffee Lunch: Protein shake, crackers and chicken salad, PB & J Dinner: Eats anything he desires - fish Stevens chicken mostly, but sometimes has fries Stevens hamburger Fasts every Monday with liquid cleanse  Limits Etoh   Activity: Works out 5-6 days per week, resistance and Arts development officer, rides 2-3 x per week Likes HIIT training   ROS: See HPI       Studies Reviewed: Marland Kitchen   EKG Interpretation Date/Time:  Wednesday February 12 2023 14:06:31 EDT Ventricular Rate:  77 PR Interval:  140 QRS Duration:  74 QT Interval:  344 QTC Calculation: 389 R Axis:   34  Text Interpretation: Normal sinus rhythm Nonspecific T wave abnormality When compared with ECG of 12-Nov-2020 01:39, No significant change was found No ST abnormality Confirmed by Eligha Bridegroom [phone]) on 02/12/2023 2:12:43 PM      Risk Assessment/Calculations:  Physical Exam:   VS: BP 126/68   Pulse 77   Ht 5\' 8"  (1.727 m)   Wt 173 lb 14.4 oz (78.9 kg)   SpO2 97%   BMI 26.44 kg/m   Wt Readings from Last 3 Encounters:  02/12/23 173 lb 14.4 oz (78.9 kg)  01/28/23 178 lb (80.7 kg)  01/15/23 173 lb (78.5 kg)     GEN: Well nourished, well developed in no acute distress NECK: No JVD; No carotid bruits CARDIAC: RRR, no murmurs, rubs, gallops RESPIRATORY:  Clear to auscultation without rales, wheezing Stevens rhonchi  ABDOMEN: Soft, non-tender, non-distended EXTREMITIES:  No edema; No deformity     ASSESSMENT AND PLAN: .    Coronary artery disease:  CT calcium score with CAC 775 (79th percentile).  Distribution of calcium is heaviest in LAD, none in left main, calcium in LCx and RCA.  He has been working out for many years including cardio exercise, resistance training and weight training. No chest pain, shortness of breath, palpitations Stevens dyspnea to suggest angina. However, due to elevated CAC, high ASCVD risk score, history of hypertension and family history, we will get exercise Myoview for evaluation of ischemia. EKG does not reveal ST abnormality. He is already on aspirin 81 mg daily, encouraged him to continue. LDL goal 70 Stevens lower.  Hyperlipidemia LDL goal < 70/Aortic atherosclerosis: LDL 63, HDL 57.9, TGs 55 on 03/22/22. He has been on statin therapy for many years but admits noncompliance at times.  Currently taking rosuvastatin 20 mg every other day.  We will get apolipoprotein B and lipoprotein a for further risk stratification.  Encouraged him to continue rosuvastatin at least every other day for now. Work on decreasing intake of saturated fat, processed foods, sugar, and simple carbohydrates. Continue regular physical activity.   Hypertension: BP is well controlled. Low sodium diet recommended. Continue losartan.   CV Risk Assessment: ASCVD risk score of 16.2%. We discussed the components of his risk.  We will get nuclear stress test for further evaluation of ischemia.  Will get LP(a) and APO B for further evaluation of lipid management. He is going to work on dietary changes as described below.   Plan/Goals: Work on dietary improvements to incorporate more plant-based options, healthy sources of fiber and protein and reduce intake of saturated fat and sugar, fried and processed foods.   Activity:  Continue regular exercise to achieve 150 minutes moderate intensity exercise each week     Informed Consent   Shared Decision Making/Informed Consent The risks [chest pain, shortness of breath, cardiac arrhythmias, dizziness, blood  pressure fluctuations, myocardial infarction, stroke/transient ischemic attack, nausea, vomiting, allergic reaction, radiation exposure, metallic taste sensation and life-threatening complications (estimated to be 1 in 10,000)], benefits (risk stratification, diagnosing coronary artery disease, treatment guidance) and alternatives of a nuclear stress test were discussed in detail with Jose Stevens and he agrees to proceed.     Dispo: 2 months with me  Signed, Eligha Bridegroom, NP-C

## 2023-02-12 NOTE — Patient Instructions (Signed)
Medication Instructions:   Your physician recommends that you continue on your current medications as directed. Please refer to the Current Medication list given to you today.   *If you need a refill on your cardiac medications before your next appointment, please call your pharmacy*   Lab Work:  Your physician recommends that you return for a FASTING NMR/LPA/Apolipoprotein on October 2 same days as stress test. You can come in on the day of your appointment anytime between 7:30-4:30 fasting from midnight the night before.    If you have labs (blood work) drawn today and your tests are completely normal, you will receive your results only by: MyChart Message (if you have MyChart) OR A paper copy in the mail If you have any lab test that is abnormal or we need to change your treatment, we will call you to review the results.   Testing/Procedures:  You are scheduled for a Myocardial Perfusion Exercise Stress Imaging Study on Wednesday, October 2  at 7:30 am.   Please arrive 15 minutes prior to your appointment time for registration and insurance purposes.   The test will take approximately 3 to 4 hours to complete; you may bring reading material. If someone comes with you to your appointment, they will need to remain in the main lobby due to limited space in the testing area.    How to prepare for your Myocardial Perfusion Stress test:   Do not eat or drink 3 hours prior to your test, except you may have water.    Do not consume products containing caffeine (regular or decaffeinated) 12 hours prior to your test (ex: coffee, chocolate, soda, tea)   Do bring a list of your current medications with you. If not listed below, you may take your medications as normal.   Bring any held medication to your appointment, as you may be required to take it once the test is complete.   Do wear comfortable clothes (no  overalls) and walking shoes. Tennis shoes are preferred. No open toed  shoes.  Do not wear cologne,  aftershave or lotions (deodorant is allowed).   If these instructions are not followed, you test will have to be rescheduled.   Please report to [address] for your test. If you have questions or concerns about your appointment, please call the Nuclear Lab at #[phone].  If you cannot keep your appointment, please provide 24 hour notification to the Nuclear lab to avoid a possible $50 charge to your account.       Follow-Up: At [facility], you and your health needs are our priority.  As part of our continuing mission to provide you with exceptional heart care, we have created designated Provider Care Teams.  These Care Teams include your primary Cardiologist (physician) and Advanced Practice Providers (APPs -  Physician Assistants and Nurse Practitioners) who all work together to provide you with the care you need, when you need it.  We recommend signing up for the patient portal called "MyChart".  Sign up information is provided on this After Visit Summary.  MyChart is used to connect with patients for Virtual Visits (Telemedicine).  Patients are able to view lab/test results, encounter notes, upcoming appointments, etc.  Non-urgent messages can be sent to your provider as well.   To learn more about what you can do with MyChart, go to [email or web address].    Your next appointment:   2 month(s)  Provider:   Eligha Bridegroom, NP

## 2023-02-14 ENCOUNTER — Telehealth (HOSPITAL_COMMUNITY): Payer: Self-pay | Admitting: *Deleted

## 2023-02-14 ENCOUNTER — Encounter (HOSPITAL_COMMUNITY): Payer: Self-pay

## 2023-02-14 NOTE — Telephone Encounter (Signed)
Spoke with pt and gave instructions for MPI. Also sent instructions through My Chart.

## 2023-02-18 ENCOUNTER — Telehealth (HOSPITAL_COMMUNITY): Payer: Self-pay

## 2023-02-18 NOTE — Addendum Note (Signed)
Addended by: Levi Aland on: 02/18/2023 10:10 AM   Modules accepted: Orders

## 2023-02-18 NOTE — Telephone Encounter (Signed)
The patient called to clarify the instructions. His questions were answered and he stated that he would be here for his test. Jose Stevens CCT

## 2023-02-19 ENCOUNTER — Encounter (HOSPITAL_BASED_OUTPATIENT_CLINIC_OR_DEPARTMENT_OTHER): Payer: Self-pay

## 2023-02-19 ENCOUNTER — Encounter (HOSPITAL_COMMUNITY): Payer: PPO

## 2023-02-19 ENCOUNTER — Ambulatory Visit (HOSPITAL_COMMUNITY): Payer: PPO | Attending: Nurse Practitioner

## 2023-02-19 ENCOUNTER — Ambulatory Visit (HOSPITAL_BASED_OUTPATIENT_CLINIC_OR_DEPARTMENT_OTHER): Payer: PPO

## 2023-02-19 DIAGNOSIS — R931 Abnormal findings on diagnostic imaging of heart and coronary circulation: Secondary | ICD-10-CM | POA: Diagnosis not present

## 2023-02-19 DIAGNOSIS — R071 Chest pain on breathing: Secondary | ICD-10-CM | POA: Diagnosis not present

## 2023-02-19 DIAGNOSIS — Z8249 Family history of ischemic heart disease and other diseases of the circulatory system: Secondary | ICD-10-CM | POA: Diagnosis not present

## 2023-02-19 DIAGNOSIS — I10 Essential (primary) hypertension: Secondary | ICD-10-CM | POA: Diagnosis not present

## 2023-02-19 DIAGNOSIS — Z136 Encounter for screening for cardiovascular disorders: Secondary | ICD-10-CM | POA: Insufficient documentation

## 2023-02-19 LAB — MYOCARDIAL PERFUSION IMAGING
Angina Index: 0
Duke Treadmill Score: 9
Estimated workload: 10.1
Exercise duration (min): 8 min
Exercise duration (sec): 55 s
LV dias vol: 86 mL (ref 62–150)
LV sys vol: 37 mL
MPHR: 151 {beats}/min
Nuc Stress EF: 58 %
Peak HR: 129 {beats}/min
Percent HR: 85 %
Rest HR: 64 {beats}/min
Rest Nuclear Isotope Dose: 10.7 mCi
SDS: 0
SRS: 0
SSS: 0
ST Depression (mm): 0 mm
Stress Nuclear Isotope Dose: 32.1 mCi
TID: 1

## 2023-02-19 MED ORDER — TECHNETIUM TC 99M TETROFOSMIN IV KIT
10.7000 | PACK | Freq: Once | INTRAVENOUS | Status: AC | PRN
  Administered 2023-02-19: 10.7 via INTRAVENOUS

## 2023-02-19 MED ORDER — TECHNETIUM TC 99M TETROFOSMIN IV KIT
32.1000 | PACK | Freq: Once | INTRAVENOUS | Status: AC | PRN
  Administered 2023-02-19: 32.1 via INTRAVENOUS

## 2023-02-20 LAB — NMR, LIPOPROFILE
Cholesterol, Total: 123 mg/dL (ref 100–199)
HDL Particle Number: 27.6 umol/L — ABNORMAL LOW (ref >=30.5)
HDL-C: 47 mg/dL (ref >39)
LDL Particle Number: 783 nmol/L (ref <1000)
LDL Size: 20.6 nmol (ref >20.5)
LDL-C (NIH Calc): 62 mg/dL (ref 0–99)
LP-IR Score: 52 — ABNORMAL HIGH (ref <=45)
Small LDL Particle Number: 451 nmol/L (ref <=527)
Triglycerides: 64 mg/dL (ref 0–149)

## 2023-02-20 LAB — LIPOPROTEIN A (LPA): Lipoprotein (a): 83.9 nmol/L — ABNORMAL HIGH (ref <75.0)

## 2023-02-20 LAB — APOLIPOPROTEIN B: Apolipoprotein B: 62 mg/dL (ref <90)

## 2023-03-10 ENCOUNTER — Ambulatory Visit: Payer: PPO | Admitting: Cardiology

## 2023-03-25 ENCOUNTER — Encounter: Payer: Self-pay | Admitting: Internal Medicine

## 2023-03-25 ENCOUNTER — Ambulatory Visit: Payer: PPO | Admitting: Internal Medicine

## 2023-03-25 ENCOUNTER — Ambulatory Visit: Payer: PPO

## 2023-03-25 VITALS — BP 124/78 | HR 75 | Temp 98.0°F | Ht 68.0 in | Wt 177.0 lb

## 2023-03-25 DIAGNOSIS — Z125 Encounter for screening for malignant neoplasm of prostate: Secondary | ICD-10-CM | POA: Diagnosis not present

## 2023-03-25 DIAGNOSIS — E559 Vitamin D deficiency, unspecified: Secondary | ICD-10-CM | POA: Diagnosis not present

## 2023-03-25 DIAGNOSIS — E538 Deficiency of other specified B group vitamins: Secondary | ICD-10-CM

## 2023-03-25 DIAGNOSIS — R739 Hyperglycemia, unspecified: Secondary | ICD-10-CM

## 2023-03-25 DIAGNOSIS — I10 Essential (primary) hypertension: Secondary | ICD-10-CM

## 2023-03-25 DIAGNOSIS — E785 Hyperlipidemia, unspecified: Secondary | ICD-10-CM | POA: Diagnosis not present

## 2023-03-25 LAB — LIPID PANEL
Cholesterol: 139 mg/dL (ref 0–200)
HDL: 57.3 mg/dL (ref >39.00)
LDL Cholesterol: 68 mg/dL (ref 0–99)
NonHDL: 82
Total CHOL/HDL Ratio: 2
Triglycerides: 71 mg/dL (ref 0.0–149.0)
VLDL: 14.2 mg/dL (ref 0.0–40.0)

## 2023-03-25 LAB — TSH: TSH: 2.61 u[IU]/mL (ref 0.35–5.50)

## 2023-03-25 LAB — BASIC METABOLIC PANEL
BUN: 11 mg/dL (ref 6–23)
CO2: 31 meq/L (ref 19–32)
Calcium: 9 mg/dL (ref 8.4–10.5)
Chloride: 102 meq/L (ref 96–112)
Creatinine, Ser: 0.92 mg/dL (ref 0.40–1.50)
GFR: 84.78 mL/min (ref >60.00)
Glucose, Bld: 88 mg/dL (ref 70–99)
Potassium: 4.8 meq/L (ref 3.5–5.1)
Sodium: 138 meq/L (ref 135–145)

## 2023-03-25 LAB — HEPATIC FUNCTION PANEL
ALT: 12 U/L (ref 0–53)
AST: 16 U/L (ref 0–37)
Albumin: 4.1 g/dL (ref 3.5–5.2)
Alkaline Phosphatase: 66 U/L (ref 39–117)
Bilirubin, Direct: 0.1 mg/dL (ref 0.0–0.3)
Total Bilirubin: 0.5 mg/dL (ref 0.2–1.2)
Total Protein: 6.8 g/dL (ref 6.0–8.3)

## 2023-03-25 LAB — CBC WITH DIFFERENTIAL/PLATELET
Basophils Absolute: 0 10*3/uL (ref 0.0–0.1)
Basophils Relative: 0.5 % (ref 0.0–3.0)
Eosinophils Absolute: 0.1 10*3/uL (ref 0.0–0.7)
Eosinophils Relative: 1.9 % (ref 0.0–5.0)
HCT: 41.2 % (ref 39.0–52.0)
Hemoglobin: 13.2 g/dL (ref 13.0–17.0)
Lymphocytes Relative: 19.3 % (ref 12.0–46.0)
Lymphs Abs: 1.4 10*3/uL (ref 0.7–4.0)
MCHC: 32 g/dL (ref 30.0–36.0)
MCV: 89.5 fL (ref 78.0–100.0)
Monocytes Absolute: 0.7 10*3/uL (ref 0.1–1.0)
Monocytes Relative: 10.5 % (ref 3.0–12.0)
Neutro Abs: 4.8 10*3/uL (ref 1.4–7.7)
Neutrophils Relative %: 67.8 % (ref 43.0–77.0)
Platelets: 258 10*3/uL (ref 150.0–400.0)
RBC: 4.6 Mil/uL (ref 4.22–5.81)
RDW: 14.2 % (ref 11.5–15.5)
WBC: 7.1 10*3/uL (ref 4.0–10.5)

## 2023-03-25 LAB — VITAMIN D 25 HYDROXY (VIT D DEFICIENCY, FRACTURES): VITD: 25.92 ng/mL — ABNORMAL LOW (ref 30.00–100.00)

## 2023-03-25 LAB — PSA: PSA: 0.01 ng/mL — ABNORMAL LOW (ref 0.10–4.00)

## 2023-03-25 LAB — MICROALBUMIN / CREATININE URINE RATIO
Creatinine,U: 61.5 mg/dL
Microalb Creat Ratio: 1.1 mg/g (ref 0.0–30.0)
Microalb, Ur: <0.7 mg/dL (ref 0.0–1.9)

## 2023-03-25 LAB — VITAMIN B12: Vitamin B-12: 891 pg/mL (ref 211–911)

## 2023-03-25 LAB — HEMOGLOBIN A1C: Hgb A1c MFr Bld: 5.9 % (ref 4.6–6.5)

## 2023-03-25 NOTE — Progress Notes (Signed)
Patient ID: Jose Stevens, male   DOB: Oct 08, 1955, 67 y.o.   MRN: [id number]        Chief Complaint: follow up HTN, HLD, low vit d, hyperglycemia       HPI:  Jose Stevens is a 67 y.o. male here overall doing ok, Pt denies chest pain, increased sob or doe, wheezing, orthopnea, PND, increased LE swelling, palpitations, dizziness or syncope.   Pt denies polydipsia, polyuria, or new focal neuro s/s.    Pt denies fever, wt loss, night sweats, loss of appetite, or other constitutional symptoms         Wt Readings from Last 3 Encounters:  03/25/23 177 lb (80.3 kg)  02/12/23 173 lb 14.4 oz (78.9 kg)  01/28/23 178 lb (80.7 kg)   BP Readings from Last 3 Encounters:  03/25/23 124/78  02/12/23 126/68  01/28/23 130/82         Past Medical History:  Diagnosis Date   GERD (gastroesophageal reflux disease)    on meds   Hemorrhoids    Hyperlipemia    [place] study LDL goal= <160. NMR Lipoprofile 2007: LDL 188 (2618/2018) HDL 38, TG 127. LDL goal= <100, ideally <75===on meds   Hypertension    on meds   Prostate cancer (HCC)    Dr Brunilda Payor   Rhinitis    seasonal   Seasonal allergies    Thyroid disease    on meds   Past Surgical History:  Procedure Laterality Date   COLONOSCOPY  05/20/2010   JP-F/V-movi(exc)-tics/HPPx1=recall 10 yrs   prostate seed implant  05/20/2006   Dr.Nesi-seed implantation (prostate CA)   TONSILLECTOMY      reports that he quit smoking about 38 years ago. His smoking use included cigarettes. He has never used smokeless tobacco. He reports that he does not currently use alcohol. He reports that he does not use drugs. family history includes Heart attack (age of onset: 63) in his maternal grandfather; Heart attack (age of onset: 93) in his brother; Heart attack (age of onset: 66) in his maternal uncle; Heart disease in his brother; Hyperlipidemia in his brother; Lung cancer in his mother; Other in his father; Prostate cancer in his brother. No Known  Allergies Current Outpatient Medications on File Prior to Visit  Medication Sig Dispense Refill   aspirin 81 MG tablet Take 81 mg by mouth daily.     cyclobenzaprine (FLEXERIL) 5 MG tablet Take 1 tablet (5 mg total) by mouth at bedtime as needed for muscle spasms. 7 tablet 0   ibuprofen (ADVIL) 800 MG tablet Take 1 tablet (800 mg total) by mouth every 6 (six) hours as needed. 20 tablet 2   levothyroxine (SYNTHROID) 50 MCG tablet TAKE 1 TABLET BY MOUTH ONCE A DAY 90 tablet 3   loratadine (CLARITIN) 10 MG tablet Take 10 mg by mouth daily as needed.     losartan (COZAAR) 50 MG tablet Take 1 tablet by mouth daily. 90 tablet 3   Multiple Vitamin (MULTI VITAMIN DAILY PO) Take 1 tablet by mouth daily as needed.     naproxen sodium (ALEVE) 220 MG tablet Take 1 tablet (220 mg total) by mouth 2 (two) times daily with a meal. (Patient taking differently: Take 220 mg by mouth 2 (two) times daily as needed.) 30 tablet    omeprazole (PRILOSEC) 20 MG capsule TAKE 1 CAPSULE BY MOUTH DAILY 90 capsule 3   rosuvastatin (CRESTOR) 20 MG tablet Take 1 tablet by mouth daily. 90 tablet 2  sildenafil (VIAGRA) 100 MG tablet Take 1 tablet (100 mg total) by mouth daily as needed. 10 tablet 11   tadalafil (CIALIS) 5 MG tablet Take 1 tablet (5 mg total) by mouth daily. 30 tablet 11   Azelastine HCl 137 MCG/SPRAY SOLN USE 2 SPRAYS IN EACH NOSTRIL 2 TIMES DAILY (Patient taking differently: Place 1 spray into both nostrils daily as needed.) 30 mL 5   No current facility-administered medications on file prior to visit.        ROS:  All others reviewed and negative.  Objective        PE:  BP 124/78 (BP Location: Right Arm, Patient Position: Sitting, Cuff Size: Normal)   Pulse 75   Temp 98 F (36.7 C) (Oral)   Ht 5\' 8"  (1.727 m)   Wt 177 lb (80.3 kg)   SpO2 98%   BMI 26.91 kg/m                 Constitutional: Pt appears in NAD               HENT: Head: NCAT.                Right Ear: External ear normal.                  Left Ear: External ear normal.                Eyes: . Pupils are equal, round, and reactive to light. Conjunctivae and EOM are normal               Nose: without d/c or deformity               Neck: Neck supple. Gross normal ROM               Cardiovascular: Normal rate and regular rhythm.                 Pulmonary/Chest: Effort normal and breath sounds without rales or wheezing.                Abd:  Soft, NT, ND, + BS, no organomegaly               Neurological: Pt is alert. At baseline orientation, motor grossly intact               Skin: Skin is warm. No rashes, no other new lesions, LE edema - none               Psychiatric: Pt behavior is normal without agitation   Micro: none  Cardiac tracings I have personally interpreted today:  none  Pertinent Radiological findings (summarize): none   Lab Results  Component Value Date   WBC 6.4 03/22/2022   HGB 14.8 03/22/2022   HCT 44.4 03/22/2022   PLT 234.0 03/22/2022   GLUCOSE 83 03/22/2022   CHOL 131 03/22/2022   TRIG 55.0 03/22/2022   HDL 57.90 03/22/2022   LDLDIRECT 160.8 05/06/2007   LDLCALC 63 03/22/2022   ALT 24 03/22/2022   AST 25 03/22/2022   NA 139 03/22/2022   K 4.4 03/22/2022   CL 101 03/22/2022   CREATININE 0.83 03/22/2022   BUN 10 03/22/2022   CO2 33 (H) 03/22/2022   TSH 1.09 03/22/2022   PSA 0.00 (L) 03/22/2022   INR 0.9 12/15/2006   HGBA1C 6.1 03/22/2022   Assessment/Plan:  Jose Stevens is a 67 y.o. Black or African American [2]  male with  has a past medical history of GERD (gastroesophageal reflux disease), Hemorrhoids, Hyperlipemia, Hypertension, Prostate cancer (HCC), Rhinitis, Seasonal allergies, and Thyroid disease.  Vitamin D deficiency Last vitamin D Lab Results  Component Value Date   VD25OH 12.54 (L) 03/22/2022   Low, to start oral replacement   Hyperlipidemia Lab Results  Component Value Date   LDLCALC 63 03/22/2022   Stable, pt to continue current statin crestor 20  qd   Hyperglycemia Lab Results  Component Value Date   HGBA1C 6.1 03/22/2022   Stable, pt to continue current medical treatment  - diet, wt control   Essential hypertension BP Readings from Last 3 Encounters:  03/25/23 124/78  02/12/23 126/68  01/28/23 130/82   Stable, pt to continue medical treatment losartan 50 qd  Followup: Return in about 6 months (around 09/22/2023).  Oliver Barre, MD 03/25/2023 9:01 PM [facility]

## 2023-03-25 NOTE — Assessment & Plan Note (Signed)
Lab Results  Component Value Date   LDLCALC 63 03/22/2022   Stable, pt to continue current statin crestor 20 qd

## 2023-03-25 NOTE — Assessment & Plan Note (Signed)
BP Readings from Last 3 Encounters:  03/25/23 124/78  02/12/23 126/68  01/28/23 130/82   Stable, pt to continue medical treatment losartan 50 qd

## 2023-03-25 NOTE — Patient Instructions (Signed)

## 2023-03-25 NOTE — Assessment & Plan Note (Signed)
Lab Results  Component Value Date   HGBA1C 6.1 03/22/2022   Stable, pt to continue current medical treatment  - diet, wt control

## 2023-03-25 NOTE — Assessment & Plan Note (Signed)
Last vitamin D Lab Results  Component Value Date   VD25OH 12.54 (L) 03/22/2022   Low, to start oral replacement

## 2023-03-26 LAB — URINALYSIS, ROUTINE W REFLEX MICROSCOPIC
Bilirubin Urine: NEGATIVE
Hgb urine dipstick: NEGATIVE
Ketones, ur: NEGATIVE
Leukocytes,Ua: NEGATIVE
Nitrite: NEGATIVE
RBC / HPF: NONE SEEN (ref 0–?)
Specific Gravity, Urine: 1.015 (ref 1.000–1.030)
Total Protein, Urine: NEGATIVE
Urine Glucose: NEGATIVE
Urobilinogen, UA: 1 (ref 0.0–1.0)
WBC, UA: NONE SEEN (ref 0–?)
pH: 7.5 (ref 5.0–8.0)

## 2023-03-26 NOTE — Progress Notes (Signed)
The test results show that your current treatment is OK, as the tests are stable.  Please continue the same plan.  There is no other need for change of treatment or further evaluation based on these results, at this time.  thanks 

## 2023-04-02 ENCOUNTER — Other Ambulatory Visit (HOSPITAL_COMMUNITY): Payer: Self-pay

## 2023-04-02 ENCOUNTER — Other Ambulatory Visit: Payer: Self-pay | Admitting: Internal Medicine

## 2023-04-02 ENCOUNTER — Other Ambulatory Visit: Payer: Self-pay

## 2023-04-02 DIAGNOSIS — Z760 Encounter for issue of repeat prescription: Secondary | ICD-10-CM

## 2023-04-02 MED ORDER — OMEPRAZOLE 20 MG PO CPDR
DELAYED_RELEASE_CAPSULE | Freq: Every day | ORAL | 3 refills | Status: DC
  Filled 2023-04-02: qty 90, 90d supply, fill #0
  Filled 2023-07-03: qty 90, 90d supply, fill #1
  Filled 2023-10-02: qty 90, 90d supply, fill #2
  Filled 2023-12-29: qty 90, 90d supply, fill #3

## 2023-04-02 MED ORDER — LEVOTHYROXINE SODIUM 50 MCG PO TABS
ORAL_TABLET | Freq: Every day | ORAL | 3 refills | Status: DC
  Filled 2023-04-02: qty 90, 90d supply, fill #0
  Filled 2023-07-07: qty 90, 90d supply, fill #1
  Filled 2023-10-02: qty 90, 90d supply, fill #2
  Filled 2023-12-29: qty 90, 90d supply, fill #3

## 2023-04-03 ENCOUNTER — Other Ambulatory Visit (HOSPITAL_COMMUNITY): Payer: Self-pay

## 2023-04-11 ENCOUNTER — Ambulatory Visit (INDEPENDENT_AMBULATORY_CARE_PROVIDER_SITE_OTHER): Payer: PPO

## 2023-04-11 VITALS — Ht 68.0 in | Wt 177.0 lb

## 2023-04-11 DIAGNOSIS — Z0000 Encounter for general adult medical examination without abnormal findings: Secondary | ICD-10-CM

## 2023-04-11 NOTE — Patient Instructions (Signed)
Mr. Fisch , Thank you for taking time to come for your Medicare Wellness Visit. I appreciate your ongoing commitment to your health goals. Please review the following plan we discussed and let me know if I can assist you in the future.   Referrals/Orders/Follow-Ups/Clinician Recommendations: You are due for the 2nd Shingles vaccine.  It was nice talking with you today and keep up the good work.  This is a list of the screening recommended for you and due dates:  Health Maintenance  Topic Date Due   COVID-19 Vaccine (4 - 2023-24 season) 04/27/2023*   Zoster (Shingles) Vaccine (2 of 2) 04/21/2023   Medicare Annual Wellness Visit  04/10/2024   Colon Cancer Screening  04/26/2029   DTaP/Tdap/Td vaccine (2 - Td or Tdap) 11/13/2030   Pneumonia Vaccine  Completed   Flu Shot  Completed   Hepatitis C Screening  Completed   HPV Vaccine  Aged Out  *Topic was postponed. The date shown is not the original due date.    Advanced directives: (Declined) Advance directive discussed with you today. Even though you declined this today, please call our office should you change your mind, and we can give you the proper paperwork for you to fill out.  Next Medicare Annual Wellness Visit scheduled for next year: Yes

## 2023-04-11 NOTE — Progress Notes (Signed)
Subjective:   Jose Stevens is a 67 y.o. male who presents for Medicare Annual/Subsequent preventive examination.  Visit Complete: Virtual I connected with  Deamonte L Gabrys on 04/11/23 by a audio enabled telemedicine application and verified that I am speaking with the correct person using two identifiers.  Patient Location: Home  Provider Location: Office/Clinic  I discussed the limitations of evaluation and management by telemedicine. The patient expressed understanding and agreed to proceed.  Vital Signs: Because this visit was a virtual/telehealth visit, some criteria may be missing or patient reported. Any vitals not documented were not able to be obtained and vitals that have been documented are patient reported.   Cardiac Risk Factors include: male gender;hypertension;advanced age (>31men, >63 women);dyslipidemia, Risk factor comments: BPH,     Objective:    Today's Vitals   04/11/23 1337  Weight: 177 lb (80.3 kg)  Height: 5\' 8"  (1.727 m)   Body mass index is 26.91 kg/m.     04/11/2023    1:53 PM 11/12/2020    1:32 AM 01/18/2016    3:26 PM  Advanced Directives  Does Patient Have a Medical Advance Directive? No No No  Would patient like information on creating a medical advance directive?  No - Patient declined No - patient declined information    Current Medications (verified) Outpatient Encounter Medications as of 04/11/2023  Medication Sig   aspirin 81 MG tablet Take 81 mg by mouth daily.   cyclobenzaprine (FLEXERIL) 5 MG tablet Take 1 tablet (5 mg total) by mouth at bedtime as needed for muscle spasms.   ibuprofen (ADVIL) 800 MG tablet Take 1 tablet (800 mg total) by mouth every 6 (six) hours as needed.   levothyroxine (SYNTHROID) 50 MCG tablet TAKE 1 TABLET BY MOUTH ONCE A DAY   loratadine (CLARITIN) 10 MG tablet Take 10 mg by mouth daily as needed.   losartan (COZAAR) 50 MG tablet Take 1 tablet by mouth daily.   Multiple Vitamin (MULTI VITAMIN DAILY PO)  Take 1 tablet by mouth daily as needed.   naproxen sodium (ALEVE) 220 MG tablet Take 1 tablet (220 mg total) by mouth 2 (two) times daily with a meal. (Patient taking differently: Take 220 mg by mouth 2 (two) times daily as needed.)   omeprazole (PRILOSEC) 20 MG capsule TAKE 1 CAPSULE BY MOUTH DAILY   rosuvastatin (CRESTOR) 20 MG tablet Take 1 tablet by mouth daily.   sildenafil (VIAGRA) 100 MG tablet Take 1 tablet (100 mg total) by mouth daily as needed.   tadalafil (CIALIS) 5 MG tablet Take 1 tablet (5 mg total) by mouth daily.   Azelastine HCl 137 MCG/SPRAY SOLN USE 2 SPRAYS IN EACH NOSTRIL 2 TIMES DAILY (Patient taking differently: Place 1 spray into both nostrils daily as needed.)   No facility-administered encounter medications on file as of 04/11/2023.    Allergies (verified) Patient has no known allergies.   History: Past Medical History:  Diagnosis Date   GERD (gastroesophageal reflux disease)    on meds   Hemorrhoids    Hyperlipemia    [place] study LDL goal= <160. NMR Lipoprofile 2007: LDL 188 (2618/2018) HDL 38, TG 127. LDL goal= <100, ideally <75===on meds   Hypertension    on meds   Prostate cancer (HCC)    Dr Brunilda Payor   Rhinitis    seasonal   Seasonal allergies    Thyroid disease    on meds   Past Surgical History:  Procedure Laterality Date   COLONOSCOPY  05/20/2010   JP-F/V-movi(exc)-tics/HPPx1=recall 10 yrs   prostate seed implant  05/20/2006   Dr.Nesi-seed implantation (prostate CA)   TONSILLECTOMY     Family History  Problem Relation Age of Onset   Lung cancer Mother        smoker   Other Father        Suicide   Prostate cancer Brother    Hyperlipidemia Brother        dyslipidemia   Heart disease Brother        5 vessel CABG   Heart attack Brother 67   Heart attack Maternal Uncle 63   Heart attack Maternal Grandfather 56   Colon cancer Neg Hx    Stroke Neg Hx    Diabetes Neg Hx    Colon polyps Neg Hx    Rectal cancer Neg Hx    Stomach  cancer Neg Hx    Esophageal cancer Neg Hx    Social History   Socioeconomic History   Marital status: Single    Spouse name: Not on file   Number of children: 4   Years of education: Not on file   Highest education level: Not on file  Occupational History   Occupation: Personal trainer/part-time  Tobacco Use   Smoking status: Former    Current packs/day: 0.00    Types: Cigarettes    Quit date: 05/20/1984    Years since quitting: 38.9   Smokeless tobacco: Never   Tobacco comments:    smoked 1971-1986, up to 1 ppd  Vaping Use   Vaping status: Never Used  Substance and Sexual Activity   Alcohol use: Not Currently    Comment: socially   Drug use: No   Sexual activity: Not on file  Other Topics Concern   Not on file  Social History Narrative   Lives alone.    Social Determinants of Health   Financial Resource Strain: Low Risk  (04/11/2023)   Overall Financial Resource Strain (CARDIA)    Difficulty of Paying Living Expenses: Not hard at all  Food Insecurity: No Food Insecurity (04/11/2023)   Hunger Vital Sign    Worried About Running Out of Food in the Last Year: Never true    Ran Out of Food in the Last Year: Never true  Transportation Needs: No Transportation Needs (04/11/2023)   PRAPARE - Administrator, Civil Service (Medical): No    Lack of Transportation (Non-Medical): No  Physical Activity: Sufficiently Active (04/11/2023)   Exercise Vital Sign    Days of Exercise per Week: 7 days    Minutes of Exercise per Session: 60 min  Stress: No Stress Concern Present (04/11/2023)   [employer] of Occupational Health - Occupational Stress Questionnaire    Feeling of Stress : Not at all  Social Connections: Moderately Integrated (04/11/2023)   Social Connection and Isolation Panel [NHANES]    Frequency of Communication with Friends and Family: More than three times a week    Frequency of Social Gatherings with Friends and Family: More than three times a  week    Attends Religious Services: More than 4 times per year    Active Member of [employer] or Organizations: Yes    Attends Engineer, structural: More than 4 times per year    Marital Status: Never married    Tobacco Counseling Counseling given: Not Answered Tobacco comments: smoked 1971-1986, up to 1 ppd   Clinical Intake:  Pre-visit preparation completed: Yes  Pain : No/denies pain  BMI - recorded: 26.91 Nutritional Status: BMI 25 -29 Overweight Nutritional Risks: None Diabetes: No  How often do you need to have someone help you when you read instructions, pamphlets, or other written materials from your doctor or pharmacy?: 1 - Never  Interpreter Needed?: No  Information entered by :: Stacye Noori, RMA   Activities of Daily Living    04/11/2023    1:38 PM 03/25/2023   12:28 PM  In your present state of health, do you have any difficulty performing the following activities:  Hearing? 0 0  Vision? 0 0  Difficulty concentrating or making decisions? 0 0  Walking or climbing stairs? 0 0  Dressing or bathing? 0 0  Doing errands, shopping? 0 0  Preparing Food and eating ? N N  Using the Toilet? N N  In the past six months, have you accidently leaked urine? N N  Do you have problems with loss of bowel control? N N  Managing your Medications? N N  Managing your Finances? N N  Housekeeping or managing your Housekeeping? N N    Patient Care Team: Corwin Levins, MD as PCP - General (Internal Medicine)  Indicate any recent Medical Services you may have received from other than [facility] providers in the past year (date may be approximate).     Assessment:   This is a routine wellness examination for Ester.  Hearing/Vision screen Hearing Screening - Comments:: Denies hearing difficulties   Vision Screening - Comments:: Wears eyeglasses   Goals Addressed             This Visit's Progress    Patient Stated       Continue to stay healthy       Depression Screen    04/11/2023    1:57 PM 03/25/2023    2:13 PM 01/28/2023    3:15 PM 01/15/2023   11:11 AM 03/22/2022    9:15 AM 03/22/2022    8:57 AM 03/12/2021    1:27 PM  PHQ 2/9 Scores  PHQ - 2 Score 0 0 0 0 1 0 0  PHQ- 9 Score 0     0     Fall Risk    04/11/2023    1:53 PM 03/25/2023    2:13 PM 03/25/2023   12:28 PM 01/28/2023    3:15 PM 01/15/2023   11:11 AM  Fall Risk   Falls in the past year? 1 0 0 0 0  Number falls in past yr: 0 0  0 0  Injury with Fall? 0 0  0 0  Risk for fall due to : No Fall Risks No Fall Risks  No Fall Risks No Fall Risks  Follow up Falls prevention discussed;Falls evaluation completed Falls evaluation completed  Falls evaluation completed Falls evaluation completed    MEDICARE RISK AT HOME: Medicare Risk at Home Any stairs in or around the home?: No Home free of loose throw rugs in walkways, pet beds, electrical cords, etc?: Yes Adequate lighting in your home to reduce risk of falls?: Yes Life alert?: No Use of a cane, walker or w/c?: No Grab bars in the bathroom?: No Shower chair or bench in shower?: No Elevated toilet seat or a handicapped toilet?: No  TIMED UP AND GO:  Was the test performed?  No    Cognitive Function:        04/11/2023    1:39 PM  6CIT Screen  What Year? 0 points  What month? 0 points  What  time? 0 points  Count back from 20 0 points  Months in reverse 0 points  Repeat phrase 0 points  Total Score 0 points    Immunizations Immunization History  Administered Date(s) Administered   Fluad Quad(high Dose 65+) 01/13/2019, 03/09/2020, 03/12/2021, 03/22/2022   Fluad Trivalent(High Dose 65+) 01/15/2023   Influenza Split 04/30/2011, 02/13/2012   Influenza Whole 03/30/2009   Influenza,inj,Quad PF,6+ Mos 05/19/2013, 06/06/2015, 02/09/2016, 06/17/2017, 05/28/2018   PFIZER(Purple Top)SARS-COV-2 Vaccination 06/11/2019, 07/02/2019, 03/27/2020   Pneumococcal Conjugate-13 02/18/2019   Pneumococcal Polysaccharide-23  03/12/2021   Tdap 11/12/2020   Zoster Recombinant(Shingrix) 02/24/2023    TDAP status: Up to date  Flu Vaccine status: Up to date  Pneumococcal vaccine status: Up to date  Covid-19 vaccine status: Completed vaccines  Qualifies for Shingles Vaccine? Yes   Zostavax completed Yes   Shingrix Completed?: No.    Education has been provided regarding the importance of this vaccine. Patient has been advised to call insurance company to determine out of pocket expense if they have not yet received this vaccine. Advised may also receive vaccine at local pharmacy or Health Dept. Verbalized acceptance and understanding.  Screening Tests Health Maintenance  Topic Date Due   COVID-19 Vaccine (4 - 2023-24 season) 04/27/2023 (Originally 01/19/2023)   Zoster Vaccines- Shingrix (2 of 2) 04/21/2023   Medicare Annual Wellness (AWV)  04/10/2024   Colonoscopy  04/26/2029   DTaP/Tdap/Td (2 - Td or Tdap) 11/13/2030   Pneumonia Vaccine 12+ Years old  Completed   INFLUENZA VACCINE  Completed   Hepatitis C Screening  Completed   HPV VACCINES  Aged Out    Health Maintenance  There are no preventive care reminders to display for this patient.   Colorectal cancer screening: Type of screening: Colonoscopy. Completed 04/26/2022. Repeat every 7 years  Lung Cancer Screening: (Low Dose CT Chest recommended if Age 42-80 years, 20 pack-year currently smoking OR have quit w/in 15years.) does not qualify.   Lung Cancer Screening Referral: N/A  Additional Screening:  Hepatitis C Screening: does qualify; Completed 06/06/2015  Vision Screening: Recommended annual ophthalmology exams for early detection of glaucoma and other disorders of the eye. Is the patient up to date with their annual eye exam?  Yes  Who is the provider or what is the name of the office in which the patient attends annual eye exams? [facility] Daiva Nakayama) If pt is not established with a provider, would they like to be referred to a provider  to establish care? No .   Dental Screening: Recommended annual dental exams for proper oral hygiene   Community Resource Referral / Chronic Care Management: CRR required this visit?  No   CCM required this visit?  No     Plan:     I have personally reviewed and noted the following in the patient's chart:   Medical and social history Use of alcohol, tobacco or illicit drugs  Current medications and supplements including opioid prescriptions. Patient is not currently taking opioid prescriptions. Functional ability and status Nutritional status Physical activity Advanced directives List of other physicians Hospitalizations, surgeries, and ER visits in previous 12 months Vitals Screenings to include cognitive, depression, and falls Referrals and appointments  In addition, I have reviewed and discussed with patient certain preventive protocols, quality metrics, and best practice recommendations. A written personalized care plan for preventive services as well as general preventive health recommendations were provided to patient.     Pennye Beeghly L Thersa Mohiuddin, CMA   04/11/2023   After  Visit Summary: (MyChart) Due to this being a telephonic visit, the after visit summary with patients personalized plan was offered to patient via MyChart   Nurse Notes: Patient is due for his second Shingrix vaccine.  He is up to date on all other health maintenance.  Patient had no concerns to address today.

## 2023-04-14 NOTE — Progress Notes (Unsigned)
Cardiology Office Note:  .   Date:  04/16/2023 ID:  Jose Stevens, DOB [birth year], MRN [id number] PCP: Corwin Levins, MD [facility] Providers Cardiologist:  None   Patient Profile: .      PMH Coronary artery disease CT calcium score 775 (79th percentile) completed 01/27/23 Negative ETT 2008 Hyperlipidemia Hypertension Family history CAD Fathers and brothers with prior MIs Prostate cancer diagnosed in 2008, cancer free  Mildly elevated lipoprotein a  Referred for new patient consult for elevated coronary calcium score.  CT calcium score completed 01/27/2023 revealed CAC score 775 (79th percentile). He previously underwent exercise stress test in 2008 which was negative. Has been on statin since approximately 2013. 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  Has been working to lose weight and weighs the least he has ever weighed. Wanted to decrease his overall body fat. Does a cleanse ever Monday of water with lemon juice and cayenne pepper. Family history of father and brothers with previous MI. He is more active and weighs considerably less than these family members. His youngest brother is a physician and also has an elevated calcium score, has not had an MI. Admits he has not been entirely consistent with rosuvastatin daily. Is currently taking every other day due to soreness, has missed doses as much as a week's worth.  He denied concerning cardiac symptoms including chest pain, palpitations, shortness of breath, dyspnea, orthopnea, PND, edema, presyncope or syncope.  His ASCVD risk score was elevated at 15.2% due to history of hyper lipidemia and hypertension. He admitted he was not a "big eater" often times not eating much in the daytime, maybe oatmeal or protein shake. Dinner is usually anything he wants - mostly fish and chicken, occasional hamburgers and fries.  He does not drink EtOH.  He fasts every Monday with a  liquid cleanse.  Through shared decision making, we elected to pursue ischemia evaluation.  He underwent exercise Myoview 02/19/2023 which was low risk, excellent exercise capacity, no evidence of ischemia or infarction, normal EF 58%.       History of Present Illness: .   Jose Stevens is a very pleasant 67 y.o. male  who is here today for follow-up. He reports feeling well and continues to exercise consistently. We discussed his stress test results in detail.  He remains asymptomatic with no chest pain, shortness of breath, orthopnea, palpitations, PND, edema, presyncope, or syncope. We discussed slightly elevated lipoprotein a. He has been reading and talking with his brother who is a physician and he is interested in pursuing PCSK9 inhibitor therapy.  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   ROS: See HPI       Studies Reviewed: .          Risk Assessment/Calculations:             Physical Exam:   VS: BP 138/68   Pulse 86   Ht 5\' 8"  (1.727 m)   Wt 180 lb (81.6 kg)   SpO2 96%   BMI 27.37 kg/m   Wt Readings from Last 3 Encounters:  04/16/23 180 lb (81.6 kg)  04/11/23 177 lb (80.3 kg)  03/25/23 177 lb (80.3 kg)     GEN: Well nourished, well developed in no acute distress NECK: No JVD; No carotid bruits CARDIAC: RRR, no murmurs, rubs, gallops RESPIRATORY:  Clear to auscultation without rales,  wheezing or rhonchi  ABDOMEN: Soft, non-tender, non-distended EXTREMITIES:  No edema; No deformity     ASSESSMENT AND PLAN: .    Coronary artery disease: CT calcium score 02/06/23 with CAC 775 (79th percentile). Exercise myoview was low risk with excellent exercise capacity achieving peak METS 10.1, normal heart rate recovery, and no concerns for ischemia or infarction.  EF was normal. He remains active and is asymptomatic. Encouraged secondary prevention including heart healthy mostly plant based diet avoiding saturated fat,  processed foods, simple carbohydrates, and sugar along with aiming for at least 150 minutes of moderate intensity exercise each week. No bleeding concerns. Continue aspirin, rosuvastatin, losartan.  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 Lipid panel 02/19/2023 revealed LDL particle #783, LDL-C 62, HDL-C 47, triglycerides 64, total cholesterol 123, small LDL particle 451.  LDL is at goal on rosuvastatin, however he would like to be considered for PCSK9 inhibitor therapy due to elevated LP(a).  We will submit a test claim to see what his cost for these agents would be.  Hypertension: BP is well controlled. Encouraged routine home monitoring for goal BP < 130/80.  Renal function is stable on lab work completed 03/25/2023.         Dispo: 1 year with Dr. Duke Salvia or me  Signed, Eligha Bridegroom, NP-C

## 2023-04-16 ENCOUNTER — Telehealth: Payer: Self-pay | Admitting: Nurse Practitioner

## 2023-04-16 ENCOUNTER — Ambulatory Visit (HOSPITAL_BASED_OUTPATIENT_CLINIC_OR_DEPARTMENT_OTHER): Payer: PPO | Admitting: Nurse Practitioner

## 2023-04-16 ENCOUNTER — Telehealth: Payer: Self-pay | Admitting: Pharmacy Technician

## 2023-04-16 ENCOUNTER — Other Ambulatory Visit (HOSPITAL_COMMUNITY): Payer: Self-pay

## 2023-04-16 ENCOUNTER — Encounter (HOSPITAL_BASED_OUTPATIENT_CLINIC_OR_DEPARTMENT_OTHER): Payer: Self-pay | Admitting: Nurse Practitioner

## 2023-04-16 VITALS — BP 138/68 | HR 86 | Ht 68.0 in | Wt 180.0 lb

## 2023-04-16 DIAGNOSIS — I2510 Atherosclerotic heart disease of native coronary artery without angina pectoris: Secondary | ICD-10-CM

## 2023-04-16 DIAGNOSIS — R931 Abnormal findings on diagnostic imaging of heart and coronary circulation: Secondary | ICD-10-CM | POA: Diagnosis not present

## 2023-04-16 DIAGNOSIS — I10 Essential (primary) hypertension: Secondary | ICD-10-CM

## 2023-04-16 DIAGNOSIS — I700 Atherosclerosis of aorta: Secondary | ICD-10-CM

## 2023-04-16 DIAGNOSIS — E785 Hyperlipidemia, unspecified: Secondary | ICD-10-CM | POA: Diagnosis not present

## 2023-04-16 NOTE — Telephone Encounter (Signed)
Pharmacy Patient Advocate Encounter   Received notification from Pt Calls Messages that prior authorization for repatha is required/requested.   Insurance verification completed.   The patient is insured through [facility] ADVANTAGE/RX ADVANCE .   Per test claim: PA required; PA submitted to above mentioned insurance via CoverMyMeds Key/confirmation #/EOC [facility] Status is pending

## 2023-04-16 NOTE — Telephone Encounter (Signed)
Yes, please submit PA

## 2023-04-16 NOTE — Telephone Encounter (Signed)
Could we do a test claim on [name] for him? He is also interested in Repatha or Praluent if either of those is more affordable. He has elevated LPa and coronary calcification.

## 2023-04-16 NOTE — Patient Instructions (Signed)
Medication Instructions:  WILL LET YOU KNOW ABOUT THE PCSK9 APPROVAL   *If you need a refill on your cardiac medications before your next appointment, please call your pharmacy*  Lab Work: NONE  Testing/Procedures: NONE  Follow-Up: At [facility], you and your health needs are our priority.  As part of our continuing mission to provide you with exceptional heart care, we have created designated Provider Care Teams.  These Care Teams include your primary Cardiologist (physician) and Advanced Practice Providers (APPs -  Physician Assistants and Nurse Practitioners) who all work together to provide you with the care you need, when you need it.  We recommend signing up for the patient portal called "MyChart".  Sign up information is provided on this After Visit Summary.  MyChart is used to connect with patients for Virtual Visits (Telemedicine).  Patients are able to view lab/test results, encounter notes, upcoming appointments, etc.  Non-urgent messages can be sent to your provider as well.   To learn more about what you can do with MyChart, go to [email or web address].    Your next appointment:   12 month(s)  Provider:   Chilton Si, MD or Gillian Shields, NP

## 2023-04-16 NOTE — Telephone Encounter (Signed)
Pharmacy Patient Advocate Encounter  Insurance verification completed.    The patient is insured through [facility] ADVANTAGE/RX ADVANCE   Ran test claim for repatha. Currently a quantity of 2 is a 28 day supply and requires PA  Ran test claim for praluent. Currently a quantity of 2 is a 28 day supply and requires PA  Ran test claim for leqvio. Currently a quantity of 1.31ml   is a 180 day supply and says not on formulary  This test claim was processed through [facility]- copay amounts may vary at other pharmacies due to pharmacy/plan contracts, or as the patient moves through the different stages of their insurance plan.

## 2023-04-18 ENCOUNTER — Other Ambulatory Visit (HOSPITAL_COMMUNITY): Payer: Self-pay

## 2023-04-18 NOTE — Telephone Encounter (Addendum)
Pharmacy Patient Advocate Encounter  Received notification from [facility] ADVANTAGE/RX ADVANCE that Prior Authorization for repatha has been APPROVED from 04/16/23 to 10/14/22. Ran test claim, Copay is $47.00- one month. This test claim was processed through [facility]- copay amounts may vary at other pharmacies due to pharmacy/plan contracts, or as the patient moves through the different stages of their insurance plan.   PA #/Case ID/Reference #: [id number]

## 2023-04-21 NOTE — Telephone Encounter (Signed)
Please notify patient that he has been approved to start Repatha at $47 per month (test claim with [facility] pharmacy - cost may differ elsewhere). If he is agreeable, recommend that he start Repatha and return for NMR after 4 injections.

## 2023-04-22 ENCOUNTER — Other Ambulatory Visit (HOSPITAL_COMMUNITY): Payer: Self-pay

## 2023-04-22 ENCOUNTER — Telehealth: Payer: Self-pay | Admitting: Pharmacy Technician

## 2023-04-22 NOTE — Telephone Encounter (Signed)
S/w pt does not want to do the PCSK9 and inject. Pt wants to start [name]. Stated would send back to Lebron Conners to review.

## 2023-04-22 NOTE — Telephone Encounter (Signed)
I have actually never ordered it. Looping in [place] for assistance.

## 2023-04-22 NOTE — Telephone Encounter (Signed)
Pharmacy Patient Advocate Encounter   Received notification from Pt Calls Messages that prior authorization for leqvio is required/requested.   Insurance verification completed.   The patient is insured through [facility] ADVANTAGE/RX ADVANCE .   Per test claim: PA required; PA submitted to above mentioned insurance via CoverMyMeds Key/confirmation #/EOC [id number] Status is pending

## 2023-04-22 NOTE — Telephone Encounter (Signed)
Patient would like to know what cost of [name] would be.

## 2023-04-23 NOTE — Telephone Encounter (Signed)
Will upload remaining questions for PA in media tab until patient decides how to move forward

## 2023-04-23 NOTE — Telephone Encounter (Signed)
Please call patient and review information regarding [name] from [facility] with him. If he chooses to proceed, we will place the orders and get him scheduled for his first injection.   We also highly recommend Repatha and Praulent which are the injections he (or a family member) would do at home. He can come in for a demonstration if needed.

## 2023-04-23 NOTE — Telephone Encounter (Signed)
Patient has HTA. This medication is a medical benefit and therefore he would be responsible for 20% of the drug cost. This could be up to ~$1200 per injection.  We could get him a healthwell grant that would help pay for this, but the grant is for $2500 per year (so the first year it may not cover the entire cost since he gets 3 injections the first year, and he would still have to pay an administration fee (up to $130 depending on if his insurance covered it).

## 2023-04-24 ENCOUNTER — Encounter: Payer: Self-pay | Admitting: Pharmacist

## 2023-04-24 ENCOUNTER — Other Ambulatory Visit: Payer: Self-pay | Admitting: Pharmacist

## 2023-04-24 NOTE — Telephone Encounter (Signed)
I sent referral to [employer] and I also sent the start form electronically to patient to sign. Will need to verify his benefits. I also think a PA will be needed.

## 2023-04-24 NOTE — Progress Notes (Signed)
Benefits investigation form sent to pt to sign

## 2023-04-24 NOTE — Telephone Encounter (Signed)
S/w pt would like to proceed with trying to get healthwell grant and take if from there. Will send to PharmD to advise.

## 2023-04-25 ENCOUNTER — Telehealth: Payer: Self-pay | Admitting: Pharmacy Technician

## 2023-04-25 NOTE — Telephone Encounter (Signed)
He isnt going to be able to afford with HTA. How do we start the pt assistance process or if I get a healthwell grant- that info can be applied correct?

## 2023-04-25 NOTE — Telephone Encounter (Addendum)
Ander Purpura note:  Auth Submission: APPROVED Site of care: Site of care: [facility] Payer: [employer] ADVT Medication & CPT/J Code(s) submitted: [name] (Inclisiran) [id number] Route of submission (phone, fax, portal):  Phone # Fax # Auth type: Buy/Bill PB Units/visits requested: X1 DOSE Reference number: [id number] Approval from: 04/24/23 to 07/23/23   NPAF - FREE DRUG Pending - [facility] aware

## 2023-04-25 NOTE — Telephone Encounter (Signed)
I will make [facility] aware and they will reach out to him for NPAF. (Free drug)  Selena Batten

## 2023-05-05 ENCOUNTER — Other Ambulatory Visit (HOSPITAL_COMMUNITY): Payer: Self-pay

## 2023-05-05 ENCOUNTER — Encounter: Payer: Self-pay | Admitting: Nurse Practitioner

## 2023-05-16 ENCOUNTER — Telehealth: Payer: Self-pay | Admitting: Cardiovascular Disease

## 2023-05-16 ENCOUNTER — Other Ambulatory Visit (HOSPITAL_BASED_OUTPATIENT_CLINIC_OR_DEPARTMENT_OTHER): Payer: Self-pay

## 2023-05-16 NOTE — Telephone Encounter (Signed)
Tried to call back, outside of business hours Will forward to [employer] and Cala Bradford C since they have been assisting with this

## 2023-05-16 NOTE — Telephone Encounter (Signed)
Jose Stevens is calling to see if we received the pt's patient assistance paperwork for [name]. Please advise

## 2023-05-22 NOTE — Telephone Encounter (Signed)
 No ma'am. Almost all the [facility] stuff we get, we send to Arminda Resides, CPhT though, we could check with Selena Batten.

## 2023-06-03 ENCOUNTER — Other Ambulatory Visit (HOSPITAL_COMMUNITY): Payer: Self-pay

## 2023-06-03 ENCOUNTER — Encounter: Payer: Self-pay | Admitting: Nurse Practitioner

## 2023-06-13 ENCOUNTER — Encounter (HOSPITAL_BASED_OUTPATIENT_CLINIC_OR_DEPARTMENT_OTHER): Payer: Self-pay | Admitting: Cardiovascular Disease

## 2023-06-13 ENCOUNTER — Encounter: Payer: Self-pay | Admitting: Internal Medicine

## 2023-06-24 ENCOUNTER — Telehealth: Payer: Self-pay | Admitting: Internal Medicine

## 2023-06-24 NOTE — Telephone Encounter (Signed)
 Copied from CRM [id number]. Topic: Clinical - Medication Question >> Jun 24, 2023  9:31 AM Isabell A wrote: Reason for CRM: Patient states he got approved for [name]  that has to be administered, he thought it would be delivered to him - would like to speak to a nurse in regard to the process of actually taking the medicaiton.

## 2023-06-25 NOTE — Telephone Encounter (Signed)
 Sorry, I was not involved in ordering this for him  ? Maybe cardiology ordered this for him

## 2023-07-01 ENCOUNTER — Encounter: Payer: Self-pay | Admitting: Internal Medicine

## 2023-07-01 ENCOUNTER — Ambulatory Visit: Payer: PPO

## 2023-07-01 VITALS — BP 129/72 | HR 65 | Temp 98.0°F | Resp 18 | Ht 68.0 in | Wt 178.6 lb

## 2023-07-01 DIAGNOSIS — R071 Chest pain on breathing: Secondary | ICD-10-CM | POA: Diagnosis not present

## 2023-07-01 DIAGNOSIS — E785 Hyperlipidemia, unspecified: Secondary | ICD-10-CM

## 2023-07-01 DIAGNOSIS — R931 Abnormal findings on diagnostic imaging of heart and coronary circulation: Secondary | ICD-10-CM | POA: Diagnosis not present

## 2023-07-01 MED ORDER — INCLISIRAN SODIUM 284 MG/1.5ML ~~LOC~~ SOSY
284.0000 mg | PREFILLED_SYRINGE | Freq: Once | SUBCUTANEOUS | Status: AC
  Administered 2023-07-01: 284 mg via SUBCUTANEOUS
  Filled 2023-07-01: qty 1.5

## 2023-07-01 NOTE — Progress Notes (Signed)
Diagnosis: Hyperlipidemia  Provider:  Chilton Greathouse MD  Procedure: Injection  [name] (inclisiran), Dose: 284 mg, Site: subcutaneous, Number of injections: 1  Injection Site(s): Left arm  Post Care: Observation period completed  Discharge: Condition: Good, Destination: Home . AVS Provided  Performed by:  Rico Ala, LPN

## 2023-07-01 NOTE — Patient Instructions (Signed)
Inclisiran Injection What is this medication? INCLISIRAN (in kli SIR an) treats high cholesterol. It works by decreasing bad cholesterol (such as LDL) in your blood. Changes to diet and exercise are often combined with this medication. This medicine may be used for other purposes; ask your health care provider or pharmacist if you have questions. COMMON BRAND NAME(S): [name] What should I tell my care team before I take this medication? They need to know if you have any of these conditions: An unusual or allergic reaction to inclisiran, other medications, foods, dyes, or preservatives Pregnant or trying to get pregnant Breast-feeding How should I use this medication? This medication is injected under the skin. It is given by your care team in a hospital or clinic setting. Talk to your care team about the use of this medication in children. Special care may be needed. Overdosage: If you think you have taken too much of this medicine contact a poison control center or emergency room at once. NOTE: This medicine is only for you. Do not share this medicine with others. What if I miss a dose? Keep appointments for follow-up doses. It is important not to miss your dose. Call your care team if you are unable to keep an appointment. What may interact with this medication? Interactions are not expected. This list may not describe all possible interactions. Give your health care provider a list of all the medicines, herbs, non-prescription drugs, or dietary supplements you use. Also tell them if you smoke, drink alcohol, or use illegal drugs. Some items may interact with your medicine. What should I watch for while using this medication? Visit your care team for regular checks on your progress. Tell your care team if your symptoms do not start to get better or if they get worse. You may need blood work while you are taking this medication. What side effects may I notice from receiving this  medication? Side effects that you should report to your care team as soon as possible: Allergic reactions--skin rash, itching, hives, swelling of the face, lips, tongue, or throat Side effects that usually do not require medical attention (report these to your care team if they continue or are bothersome): Joint pain Pain, redness, or irritation at injection site This list may not describe all possible side effects. Call your doctor for medical advice about side effects. You may report side effects to FDA at 1-800-FDA-1088. Where should I keep my medication? This medication is given in a hospital or clinic. It will not be stored at home. NOTE: This sheet is a summary. It may not cover all possible information. If you have questions about this medicine, talk to your doctor, pharmacist, or health care provider.  2024 Elsevier/Gold Standard (2023-04-18 00:00:00)

## 2023-07-03 ENCOUNTER — Other Ambulatory Visit: Payer: Self-pay

## 2023-07-03 ENCOUNTER — Encounter: Payer: Self-pay | Admitting: Nurse Practitioner

## 2023-07-03 ENCOUNTER — Other Ambulatory Visit (HOSPITAL_COMMUNITY): Payer: Self-pay

## 2023-07-03 ENCOUNTER — Other Ambulatory Visit: Payer: Self-pay | Admitting: Internal Medicine

## 2023-07-03 MED ORDER — LOSARTAN POTASSIUM 50 MG PO TABS
50.0000 mg | ORAL_TABLET | Freq: Every day | ORAL | 3 refills | Status: DC
  Filled 2023-07-03: qty 90, 90d supply, fill #0
  Filled 2023-10-02: qty 90, 90d supply, fill #1
  Filled 2023-12-29: qty 90, 90d supply, fill #2
  Filled 2024-03-25: qty 90, 90d supply, fill #3

## 2023-07-07 ENCOUNTER — Other Ambulatory Visit (HOSPITAL_COMMUNITY): Payer: Self-pay

## 2023-08-04 ENCOUNTER — Other Ambulatory Visit (HOSPITAL_COMMUNITY): Payer: Self-pay

## 2023-08-04 ENCOUNTER — Encounter: Payer: Self-pay | Admitting: Nurse Practitioner

## 2023-09-03 ENCOUNTER — Other Ambulatory Visit (HOSPITAL_COMMUNITY): Payer: Self-pay

## 2023-09-03 ENCOUNTER — Encounter: Payer: Self-pay | Admitting: Nurse Practitioner

## 2023-09-29 ENCOUNTER — Encounter: Payer: Self-pay | Admitting: Internal Medicine

## 2023-09-29 ENCOUNTER — Ambulatory Visit: Payer: PPO

## 2023-09-29 MED ORDER — INCLISIRAN SODIUM 284 MG/1.5ML ~~LOC~~ SOSY
284.0000 mg | PREFILLED_SYRINGE | Freq: Once | SUBCUTANEOUS | Status: DC
  Filled 2023-09-29: qty 1.5

## 2023-10-02 ENCOUNTER — Other Ambulatory Visit (HOSPITAL_COMMUNITY): Payer: Self-pay

## 2023-10-02 ENCOUNTER — Other Ambulatory Visit: Payer: Self-pay

## 2023-10-02 ENCOUNTER — Encounter: Payer: Self-pay | Admitting: Nurse Practitioner

## 2023-10-02 ENCOUNTER — Other Ambulatory Visit: Payer: Self-pay | Admitting: Internal Medicine

## 2023-10-02 MED ORDER — ROSUVASTATIN CALCIUM 20 MG PO TABS
20.0000 mg | ORAL_TABLET | Freq: Every day | ORAL | 2 refills | Status: DC
  Filled 2023-10-02: qty 90, 90d supply, fill #0

## 2023-10-08 ENCOUNTER — Ambulatory Visit (INDEPENDENT_AMBULATORY_CARE_PROVIDER_SITE_OTHER)

## 2023-10-08 VITALS — BP 147/80 | HR 75 | Temp 97.5°F | Resp 18 | Wt 179.4 lb

## 2023-10-08 DIAGNOSIS — E785 Hyperlipidemia, unspecified: Secondary | ICD-10-CM | POA: Diagnosis not present

## 2023-10-08 DIAGNOSIS — R071 Chest pain on breathing: Secondary | ICD-10-CM

## 2023-10-08 DIAGNOSIS — R931 Abnormal findings on diagnostic imaging of heart and coronary circulation: Secondary | ICD-10-CM | POA: Diagnosis not present

## 2023-10-08 MED ORDER — INCLISIRAN SODIUM 284 MG/1.5ML ~~LOC~~ SOSY
284.0000 mg | PREFILLED_SYRINGE | Freq: Once | SUBCUTANEOUS | Status: AC
  Administered 2023-10-08: 284 mg via SUBCUTANEOUS
  Filled 2023-10-08: qty 1.5

## 2023-10-08 NOTE — Progress Notes (Signed)
 Diagnosis: Hyperlipidemia  Provider:  Mannam, Praveen MD  Procedure: Injection  [name]  (inclisiran), Dose: 284 mg, Site: subcutaneous, Number of injections: 1  Injection Site(s): Left upper quad. abdomen  Post Care:    Discharge: Condition: Good, Destination: Home . AVS Provided  Performed by:  Katrina Brosh, RN

## 2023-10-18 ENCOUNTER — Encounter: Payer: Self-pay | Admitting: *Deleted

## 2023-10-18 ENCOUNTER — Other Ambulatory Visit: Payer: Self-pay

## 2023-10-18 ENCOUNTER — Ambulatory Visit
Admission: EM | Admit: 2023-10-18 | Discharge: 2023-10-18 | Disposition: A | Discharge status: Home / Self Care | Attending: Internal Medicine | Admitting: Internal Medicine

## 2023-10-18 DIAGNOSIS — S76111A Strain of right quadriceps muscle, fascia and tendon, initial encounter: Secondary | ICD-10-CM

## 2023-10-18 MED ORDER — METHOCARBAMOL 500 MG PO TABS: 500.0000 mg | ORAL_TABLET | Freq: Two times a day (BID) | ORAL | 0 refills | Status: DC

## 2023-10-18 MED ORDER — DICLOFENAC SODIUM 1 % EX GEL: 2.0000 g | Freq: Four times a day (QID) | CUTANEOUS | 2 refills | Status: DC

## 2023-10-18 MED ORDER — PREDNISONE 20 MG PO TABS
40.0000 mg | ORAL_TABLET | Freq: Every day | ORAL | 0 refills | Status: AC
Start: 2023-10-18 — End: 2023-10-23

## 2023-10-18 NOTE — Discharge Instructions (Addendum)
 Symptoms are consistent with a strain or inflammation of the vastus lateralis muscle.  We are unable to definitively say the cause but overuse, strain, or possibly medication related.  We can try and treat as if this is overuse/strain and if symptoms fail to improve or resolve then may need to consider following up with your primary care physician versus an orthopedist.  We will treat with the following: Prednisone 40 mg (2 tablets) once daily for 5 days. Take this in the morning.  This is a steroid to help with inflammation and pain.  Do not take ibuprofen  or naproxen  while you are taking this medication. Robaxin 500 mg every 12 hours as needed for muscle spasms.  Use caution as this medication can cause drowsiness.  Voltaren topical gel 4 times daily as needed for inflammation. Continue light stretching and foam rollers. May use moist heat on the area. Avoid prolonged usage of the quadricep muscles for the next 5 to 7 days to allow the area to rest. May want to consider using a thigh compression sleeve If symptoms fail to improve or resolve then may need to consider follow-up with your primary care doctor versus an orthopedist

## 2023-10-18 NOTE — ED Triage Notes (Signed)
 Pt reports right upper thigh pain since Tuesday. 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 He just started [name]  injections and wonders if it may be related. Denies fall/injury/recent travel. Denies SOB. "Hurts to walk and I've never experienced that"
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
## 2023-10-21 ENCOUNTER — Other Ambulatory Visit (HOSPITAL_BASED_OUTPATIENT_CLINIC_OR_DEPARTMENT_OTHER): Payer: Self-pay

## 2023-10-21 ENCOUNTER — Other Ambulatory Visit (HOSPITAL_COMMUNITY): Payer: Self-pay

## 2023-10-21 ENCOUNTER — Ambulatory Visit (INDEPENDENT_AMBULATORY_CARE_PROVIDER_SITE_OTHER): Admitting: Internal Medicine

## 2023-10-21 ENCOUNTER — Encounter: Payer: Self-pay | Admitting: Internal Medicine

## 2023-10-21 VITALS — BP 140/76 | HR 105 | Temp 97.8°F | Ht 68.0 in | Wt 182.0 lb

## 2023-10-21 DIAGNOSIS — S76101A Unspecified injury of right quadriceps muscle, fascia and tendon, initial encounter: Secondary | ICD-10-CM | POA: Diagnosis not present

## 2023-10-21 DIAGNOSIS — E559 Vitamin D deficiency, unspecified: Secondary | ICD-10-CM | POA: Diagnosis not present

## 2023-10-21 DIAGNOSIS — E785 Hyperlipidemia, unspecified: Secondary | ICD-10-CM

## 2023-10-21 DIAGNOSIS — I10 Essential (primary) hypertension: Secondary | ICD-10-CM

## 2023-10-21 MED ORDER — TRAMADOL HCL 50 MG PO TABS
50.0000 mg | ORAL_TABLET | Freq: Four times a day (QID) | ORAL | 0 refills | Status: DC | PRN
  Filled 2023-10-21: qty 30, 7d supply, fill #0

## 2023-10-21 NOTE — Assessment & Plan Note (Signed)
 Last vitamin D  Lab Results  Component Value Date   VD25OH 25.92 (L) 03/25/2023   Low to start oral replacement

## 2023-10-21 NOTE — Progress Notes (Signed)
 Patient ID: CHAYNE BAUMGART, male   DOB: 1956-04-13, 68 y.o.   MRN: [id number]        Chief Complaint: follow up f/u UC visit may 31 with right leg pain       HPI:  HAITHAM DOLINSKY is a 68 y.o. male here with c/o persistent mod to severe pain to right upper leg anterolateral quad mid aspect, started about 1 day after hitting the exact spot on hard furniture, then went on to exercise soon after as he normally does.  Has some right lower back pain and mid lateral right lower leg pain as well but not sure is related.  Limping to walk but no falls . [facility] with dx quadricepts strain , tx with prednisone, robaxin prn, and volt gel topical prn.  Pain persists not really improving.         Wt Readings from Last 3 Encounters:  10/21/23 182 lb (82.6 kg)  10/08/23 179 lb 6.4 oz (81.4 kg)  07/01/23 178 lb 9.6 oz (81 kg)   BP Readings from Last 3 Encounters:  10/21/23 (!) 140/76  10/18/23 129/83  10/08/23 (!) 147/80         Past Medical History:  Diagnosis Date   GERD (gastroesophageal reflux disease)    on meds   Hemorrhoids    Hyperlipemia    [place] study LDL goal= <160. NMR Lipoprofile 2007: LDL 188 (2618/2018) HDL 38, TG 127. LDL goal= <100, ideally <75===on meds   Hypertension    on meds   Prostate cancer (HCC)    Dr Levi Real   Rhinitis    seasonal   Seasonal allergies    Thyroid  disease    on meds   Past Surgical History:  Procedure Laterality Date   COLONOSCOPY  05/20/2010   JP-F/V-movi(exc)-tics/HPPx1=recall 10 yrs   prostate seed implant  05/20/2006   Dr.Nesi-seed implantation (prostate CA)   TONSILLECTOMY      reports that he quit smoking about 39 years ago. His smoking use included cigarettes. He has never used smokeless tobacco. He reports current alcohol use. He reports that he does not use drugs. family history includes Heart attack (age of onset: 58) in his maternal grandfather; Heart attack (age of onset: 57) in his brother; Heart attack (age of onset: 49)  in his maternal uncle; Heart disease in his brother; Hyperlipidemia in his brother; Lung cancer in his mother; Other in his father; Prostate cancer in his brother. No Known Allergies Current Outpatient Medications on File Prior to Visit  Medication Sig Dispense Refill   aspirin 81 MG tablet Take 81 mg by mouth daily.     cyclobenzaprine  (FLEXERIL ) 5 MG tablet Take 1 tablet (5 mg total) by mouth at bedtime as needed for muscle spasms. 7 tablet 0   diclofenac Sodium (VOLTAREN) 1 % GEL Apply 2 g topically 4 (four) times daily. 120 g 2   ibuprofen  (ADVIL ) 800 MG tablet Take 1 tablet (800 mg total) by mouth every 6 (six) hours as needed. 20 tablet 2   levothyroxine  (SYNTHROID ) 50 MCG tablet TAKE 1 TABLET BY MOUTH ONCE A DAY 90 tablet 3   loratadine (CLARITIN) 10 MG tablet Take 10 mg by mouth daily as needed.     losartan  (COZAAR ) 50 MG tablet Take 1 tablet (50 mg total) by mouth daily. 90 tablet 3   methocarbamol (ROBAXIN) 500 MG tablet Take 1 tablet (500 mg total) by mouth 2 (two) times daily. 20 tablet 0  Multiple Vitamin (MULTI VITAMIN DAILY PO) Take 1 tablet by mouth daily as needed.     naproxen  sodium (ALEVE ) 220 MG tablet Take 1 tablet (220 mg total) by mouth 2 (two) times daily with a meal. (Patient taking differently: Take 220 mg by mouth 2 (two) times daily as needed.) 30 tablet    omeprazole  (PRILOSEC) 20 MG capsule TAKE 1 CAPSULE BY MOUTH DAILY 90 capsule 3   predniSONE (DELTASONE) 20 MG tablet Take 2 tablets (40 mg total) by mouth daily with breakfast for 5 days. 10 tablet 0   rosuvastatin  (CRESTOR ) 20 MG tablet Take 1 tablet by mouth daily. 90 tablet 2   sildenafil  (VIAGRA ) 100 MG tablet Take 1 tablet (100 mg total) by mouth daily as needed. 10 tablet 11   tadalafil  (CIALIS ) 5 MG tablet Take 1 tablet (5 mg total) by mouth daily. 30 tablet 11   Azelastine  HCl 137 MCG/SPRAY SOLN USE 2 SPRAYS IN EACH NOSTRIL 2 TIMES DAILY (Patient taking differently: Place 1 spray into both nostrils daily  as needed.) 30 mL 5   No current facility-administered medications on file prior to visit.        ROS:  All others reviewed and negative.  Objective        PE:  BP (!) 140/76 (BP Location: Right Arm, Patient Position: Sitting, Cuff Size: Normal)   Pulse (!) 105   Temp 97.8 F (36.6 C) (Oral)   Ht 5\' 8"  (1.727 m)   Wt 182 lb (82.6 kg)   SpO2 98%   BMI 27.67 kg/m                 Constitutional: Pt appears in NAD               HENT: Head: NCAT.                Right Ear: External ear normal.                 Left Ear: External ear normal.                Eyes: . Pupils are equal, round, and reactive to light. Conjunctivae and EOM are normal               Nose: without d/c or deformity               Neck: Neck supple. Gross normal ROM               Cardiovascular: Normal rate and regular rhythm.                 Pulmonary/Chest: Effort normal and breath sounds without rales or wheezing.                Abd:  Soft, NT, ND, + BS, no organomegaly               Neurological: Pt is alert. At baseline orientation, motor grossly intact               Skin: Skin is warm. No rashes, no other new lesions, LE edema - none but has nondiscrete warmth swelling right anterolateral about 6 cm area               Psychiatric: Pt behavior is normal without agitation   Micro: none  Cardiac tracings I have personally interpreted today:  none  Pertinent Radiological findings (summarize): none   Lab Results  Component Value Date  WBC 7.1 03/25/2023   HGB 13.2 03/25/2023   HCT 41.2 03/25/2023   PLT 258.0 03/25/2023   GLUCOSE 88 03/25/2023   CHOL 139 03/25/2023   TRIG 71.0 03/25/2023   HDL 57.30 03/25/2023   LDLDIRECT 160.8 05/06/2007   LDLCALC 68 03/25/2023   ALT 12 03/25/2023   AST 16 03/25/2023   NA 138 03/25/2023   K 4.8 03/25/2023   CL 102 03/25/2023   CREATININE 0.92 03/25/2023   BUN 11 03/25/2023   CO2 31 03/25/2023   TSH 2.61 03/25/2023   PSA 0.01 (L) 03/25/2023   INR 0.9 12/15/2006    HGBA1C 5.9 03/25/2023   MICROALBUR <0.7 03/25/2023   Assessment/Plan:  DRAGON THRUSH is a 68 y.o. Black or African American [2] male with  has a past medical history of GERD (gastroesophageal reflux disease), Hemorrhoids, Hyperlipemia, Hypertension, Prostate cancer (HCC), Rhinitis, Seasonal allergies, and Thyroid  disease.  Injury of right quadriceps femoris muscle Pain mod to severe persistent, not better with prednisone, volt gel and robaxin, Suspect he may quad tear vs contusion - for MRI right femur, tramadol prn,  Essential hypertension BP Readings from Last 3 Encounters:  10/21/23 (!) 140/76  10/18/23 129/83  10/08/23 (!) 147/80   Unocntrolled, likely reactive, pt to continue medical treatment losartan  50 mg every day as declines other change   Vitamin D  deficiency Last vitamin D  Lab Results  Component Value Date   VD25OH 25.92 (L) 03/25/2023   Low to start oral replacement   Hyperlipidemia Tolerating leqvio  , for f/u lipid clinic  Followup: Return if symptoms worsen or fail to improve.  Rosalia Colonel, MD 10/21/2023 6:37 PM [facility]

## 2023-10-21 NOTE — Assessment & Plan Note (Signed)
 BP Readings from Last 3 Encounters:  10/21/23 (!) 140/76  10/18/23 129/83  10/08/23 (!) 147/80   Unocntrolled, likely reactive, pt to continue medical treatment losartan  50 mg every day as declines other change

## 2023-10-21 NOTE — Patient Instructions (Signed)
 Please take all new medication as prescribed  - the tramadol for pain  Please continue all other medications as before, and refills have been done if requested.  Please have the pharmacy call with any other refills you may need.  Please keep your appointments with your specialists as you may have planned  You will be contacted regarding the referral for: MRI right femur  Please see Sports Medicine if the MRI is denied.

## 2023-10-21 NOTE — Assessment & Plan Note (Signed)
 Pain mod to severe persistent, not better with prednisone, volt gel and robaxin, Suspect he may quad tear vs contusion - for MRI right femur, tramadol prn,

## 2023-10-21 NOTE — Assessment & Plan Note (Signed)
 Tolerating leqvio  , for f/u lipid clinic

## 2023-10-22 ENCOUNTER — Ambulatory Visit: Admitting: Internal Medicine

## 2023-11-03 ENCOUNTER — Ambulatory Visit
Admission: RE | Admit: 2023-11-03 | Discharge: 2023-11-03 | Disposition: A | Discharge status: Home / Self Care | Source: Ambulatory Visit | Attending: Internal Medicine | Admitting: Internal Medicine

## 2023-11-03 DIAGNOSIS — M79651 Pain in right thigh: Secondary | ICD-10-CM | POA: Diagnosis not present

## 2023-11-03 DIAGNOSIS — S76101A Unspecified injury of right quadriceps muscle, fascia and tendon, initial encounter: Secondary | ICD-10-CM

## 2023-11-04 ENCOUNTER — Ambulatory Visit: Payer: Self-pay | Admitting: Internal Medicine

## 2023-11-04 ENCOUNTER — Other Ambulatory Visit: Payer: Self-pay | Admitting: Internal Medicine

## 2023-11-04 DIAGNOSIS — S76819A Strain of other specified muscles, fascia and tendons at thigh level, unspecified thigh, initial encounter: Secondary | ICD-10-CM

## 2023-11-13 ENCOUNTER — Encounter: Payer: Self-pay | Admitting: Family Medicine

## 2023-11-13 ENCOUNTER — Ambulatory Visit: Admitting: Family Medicine

## 2023-11-13 ENCOUNTER — Other Ambulatory Visit: Payer: Self-pay

## 2023-11-13 VITALS — BP 102/78 | HR 92 | Ht 68.0 in | Wt 177.0 lb

## 2023-11-13 DIAGNOSIS — M79651 Pain in right thigh: Secondary | ICD-10-CM | POA: Diagnosis not present

## 2023-11-13 DIAGNOSIS — S76101A Unspecified injury of right quadriceps muscle, fascia and tendon, initial encounter: Secondary | ICD-10-CM

## 2023-11-13 NOTE — Patient Instructions (Signed)
 Thank you for coming in today.   Work on [employer].   We can refer to PT if not improvement.   See you back as needed.

## 2023-11-13 NOTE — Progress Notes (Signed)
   I, Leotis Batter, CMA acting as a scribe for Artist Lloyd, MD.  BRICEN VICTORY is a 68 y.o. male who presents to [facility] at [facility] today for R thigh pain ongoing since around the end of May. Does not recall any injury. 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 Pt was seen at [facility] on 5/31. Pt locates pain to proximal anterior thigh.   Radiates: intermittent hip to thigh Aggravates: sit-to-stand, standing in place Treatments tried: Prednisone , Robaxin , Voltaren  gel, Tramadol   Dx imaging: 11/03/23 R femur MRI  Pertinent review of systems: No fevers or chills  Relevant historical information: Hypertension.  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   Exam:  BP 102/78   Pulse 92   Ht 5' 8 (1.727 m)   Wt 177 lb (80.3 kg)   SpO2 97%   BMI 26.91 kg/m  General: Well Developed, well nourished, and in no acute distress.   MSK: Right thigh normal-appearing Mildly tender palpation proximal anterior thigh.  Normal motion.    Lab and Radiology Results  MR [facility] RIGHT WO CONTRAST Result Date: 11/03/2023 EXAM DESCRIPTION: MR FEMUR RIGHT WO CONTRAST CLINICAL HISTORY: 68 years, Male, right quadricep pain swelling after hit on furniture COMPARISON: None TECHNIQUE: MRI of the femur was performed with multiplanar multi sequence imaging according to our usual protocol. FINDINGS: No fracture. No significant degenerative change. The marrow signal is unremarkable. Mild myositis to the proximal rectus femoris musculature consistent with strain/minimal intramuscular tear. The musculature and tendons are otherwise unremarkable. IMPRESSION: Strain/minimal intramuscular tear to the rectus femoris. Electronically signed by: Reyes Frees MD 11/03/2023 06:51 PM EDT RP Workstation: [id number]   LILLETTE Artist Lloyd, personally (independently) visualized and performed the interpretation of the images attached in this note.      Assessment and Plan: 68 y.o. male  with right quad strain or tear.  This is a small tear seen on MRI of the femur.  Tear occurs of the quadratus femoris muscle.  Symptoms are consistent with this.  He is improving.  Plan on home exercise program.  If not improving he will let me know and I can refer to physical therapy which would be quite helpful.  Reviewed exercise and rehab activities.   PDMP not reviewed this encounter. No orders of the defined types were placed in this encounter.  No orders of the defined types were placed in this encounter.    Discussed warning signs or symptoms. Please see discharge instructions. Patient expresses understanding.   The above documentation has been reviewed and is accurate and complete Artist Lloyd, M.D.

## 2023-11-26 ENCOUNTER — Telehealth: Payer: Self-pay | Admitting: Family Medicine

## 2023-11-26 DIAGNOSIS — M79651 Pain in right thigh: Secondary | ICD-10-CM

## 2023-11-26 DIAGNOSIS — S76101A Unspecified injury of right quadriceps muscle, fascia and tendon, initial encounter: Secondary | ICD-10-CM

## 2023-11-26 NOTE — Telephone Encounter (Signed)
 Order has been placed for PT at [facility].

## 2023-11-26 NOTE — Telephone Encounter (Signed)
 Pt seen recently for R thigh pain and Physical Therapy discussed.  Pt came by the clinic today and would like Dr. Joane to order PT and he would like to have it at [facility].

## 2023-12-05 ENCOUNTER — Encounter: Payer: Self-pay | Admitting: Physical Therapy

## 2023-12-05 ENCOUNTER — Ambulatory Visit: Admitting: Physical Therapy

## 2023-12-05 ENCOUNTER — Other Ambulatory Visit: Payer: Self-pay

## 2023-12-05 DIAGNOSIS — M6281 Muscle weakness (generalized): Secondary | ICD-10-CM

## 2023-12-05 DIAGNOSIS — M79651 Pain in right thigh: Secondary | ICD-10-CM | POA: Diagnosis not present

## 2023-12-05 NOTE — Patient Instructions (Signed)
 Access Code: [email or web address] URL: [email or web address] Date: 12/05/2023 Prepared by: Elaine Daring  Exercises - Supine Dynamic Modified Debby Hawthorne and Hip Flexor Dynamic Stretch  - 1 x daily - 10 reps - 5 secondss hold - Active Straight Leg Raise with Quad Set  - 1 x daily - 3 sets - 10 reps - Single Leg Knee Extension with Weight Machine  - 1 x daily - 3 sets - 10 reps - Squat  - 2-3 x weekly - 3 sets - 10 reps
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
## 2023-12-17 ENCOUNTER — Ambulatory Visit: Admitting: Physical Therapy

## 2023-12-22 ENCOUNTER — Encounter: Payer: Self-pay | Admitting: Physical Therapy

## 2023-12-22 ENCOUNTER — Other Ambulatory Visit: Payer: Self-pay

## 2023-12-22 ENCOUNTER — Ambulatory Visit: Admitting: Physical Therapy

## 2023-12-22 DIAGNOSIS — M79651 Pain in right thigh: Secondary | ICD-10-CM

## 2023-12-22 DIAGNOSIS — M6281 Muscle weakness (generalized): Secondary | ICD-10-CM | POA: Diagnosis not present
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
## 2023-12-29 ENCOUNTER — Encounter: Payer: Self-pay | Admitting: Nurse Practitioner

## 2023-12-29 ENCOUNTER — Ambulatory Visit: Admitting: Physical Therapy

## 2023-12-29 ENCOUNTER — Other Ambulatory Visit: Payer: Self-pay

## 2023-12-29 ENCOUNTER — Other Ambulatory Visit (HOSPITAL_COMMUNITY): Payer: Self-pay

## 2023-12-29 ENCOUNTER — Encounter: Payer: Self-pay | Admitting: Physical Therapy

## 2023-12-29 DIAGNOSIS — M6281 Muscle weakness (generalized): Secondary | ICD-10-CM | POA: Diagnosis not present

## 2023-12-29 DIAGNOSIS — M79651 Pain in right thigh: Secondary | ICD-10-CM | POA: Diagnosis not present
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
## 2023-12-30 ENCOUNTER — Other Ambulatory Visit (HOSPITAL_COMMUNITY): Payer: Self-pay

## 2024-01-05 ENCOUNTER — Ambulatory Visit: Admitting: Physical Therapy

## 2024-01-05 ENCOUNTER — Other Ambulatory Visit: Payer: Self-pay

## 2024-01-05 ENCOUNTER — Encounter: Payer: Self-pay | Admitting: Physical Therapy

## 2024-01-05 DIAGNOSIS — M6281 Muscle weakness (generalized): Secondary | ICD-10-CM | POA: Diagnosis not present

## 2024-01-05 DIAGNOSIS — M79651 Pain in right thigh: Secondary | ICD-10-CM
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
## 2024-01-12 ENCOUNTER — Encounter: Payer: Self-pay | Admitting: Physical Therapy

## 2024-01-12 ENCOUNTER — Ambulatory Visit: Admitting: Physical Therapy

## 2024-01-12 ENCOUNTER — Other Ambulatory Visit: Payer: Self-pay

## 2024-01-12 ENCOUNTER — Encounter: Admitting: Physical Therapy

## 2024-01-12 DIAGNOSIS — M79651 Pain in right thigh: Secondary | ICD-10-CM

## 2024-01-12 DIAGNOSIS — M6281 Muscle weakness (generalized): Secondary | ICD-10-CM
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
## 2024-01-15 ENCOUNTER — Encounter: Admitting: Physical Therapy

## 2024-01-29 ENCOUNTER — Encounter: Admitting: Physical Therapy

## 2024-02-11 ENCOUNTER — Telehealth: Payer: Self-pay | Admitting: Pharmacist

## 2024-02-11 ENCOUNTER — Other Ambulatory Visit (HOSPITAL_COMMUNITY): Payer: Self-pay

## 2024-02-11 DIAGNOSIS — E7849 Other hyperlipidemia: Secondary | ICD-10-CM

## 2024-02-11 MED ORDER — ROSUVASTATIN CALCIUM 20 MG PO TABS
20.0000 mg | ORAL_TABLET | ORAL | 1 refills | Status: DC
  Filled 2024-02-11: qty 36, 84d supply, fill #0

## 2024-02-11 NOTE — Telephone Encounter (Signed)
 Pharmacy Quality Measure Review  This patient is appearing on a report for being at risk of failing the adherence measure for cholesterol (statin) medications this calendar year.   Medication: Rosuvastatin  20 mg Last fill date: 10/02/23 for 90 day supply  Contacted patient. He states he is still taking but is taking rosuvastatin  3 days per week, which was previously discussed with PCP. He has supply remaining and does not currently need a refill. Current Rx is written to take daily. Pt will fail adherence metric for this year. Corrected Rx sent to be on file for next refill.  Darrelyn Drum, PharmD, BCPS, CPP Clinical Pharmacist Practitioner [facility] [phone]

## 2024-02-23 ENCOUNTER — Other Ambulatory Visit (HOSPITAL_COMMUNITY): Payer: Self-pay

## 2024-02-25 ENCOUNTER — Ambulatory Visit

## 2024-02-26 ENCOUNTER — Ambulatory Visit

## 2024-02-26 DIAGNOSIS — Z23 Encounter for immunization: Secondary | ICD-10-CM | POA: Diagnosis not present

## 2024-02-26 NOTE — Progress Notes (Signed)
 Pt was given High Dose Flu vaccine with no complications.

## 2024-03-25 ENCOUNTER — Other Ambulatory Visit: Payer: Self-pay

## 2024-03-25 ENCOUNTER — Other Ambulatory Visit (HOSPITAL_COMMUNITY): Payer: Self-pay

## 2024-03-25 ENCOUNTER — Other Ambulatory Visit: Payer: Self-pay | Admitting: Internal Medicine

## 2024-03-25 DIAGNOSIS — Z760 Encounter for issue of repeat prescription: Secondary | ICD-10-CM

## 2024-03-25 MED ORDER — LEVOTHYROXINE SODIUM 50 MCG PO TABS
ORAL_TABLET | Freq: Every day | ORAL | 3 refills | Status: DC
  Filled 2024-03-25: qty 90, 90d supply, fill #0

## 2024-03-25 MED ORDER — OMEPRAZOLE 20 MG PO CPDR
DELAYED_RELEASE_CAPSULE | Freq: Every day | ORAL | 3 refills | Status: DC
  Filled 2024-03-25: qty 90, 90d supply, fill #0

## 2024-04-09 ENCOUNTER — Ambulatory Visit

## 2024-04-09 VITALS — BP 129/78 | HR 83 | Temp 97.4°F | Resp 18 | Ht 68.0 in | Wt 183.6 lb

## 2024-04-09 DIAGNOSIS — E785 Hyperlipidemia, unspecified: Secondary | ICD-10-CM

## 2024-04-09 DIAGNOSIS — R931 Abnormal findings on diagnostic imaging of heart and coronary circulation: Secondary | ICD-10-CM

## 2024-04-09 DIAGNOSIS — R071 Chest pain on breathing: Secondary | ICD-10-CM | POA: Diagnosis not present

## 2024-04-09 MED ORDER — INCLISIRAN SODIUM 284 MG/1.5ML ~~LOC~~ SOSY
284.0000 mg | PREFILLED_SYRINGE | Freq: Once | SUBCUTANEOUS | Status: AC
  Administered 2024-04-09: 284 mg via SUBCUTANEOUS
  Filled 2024-04-09: qty 1.5

## 2024-04-09 NOTE — Progress Notes (Signed)
 Diagnosis: Hyperlipidemia  Provider:  Mannam, Praveen MD  Procedure: Injection  [name]  (inclisiran), Dose: 284 mg, Site: subcutaneous, Number of injections: 1  Injection Site(s): Left lower quad. abdomen  Post Care: left lower quad abdominal injection  Discharge: Condition: Good, Destination: Home . AVS Declined  Performed by:  Maximiano JONELLE Pouch, LPN

## 2024-04-13 ENCOUNTER — Ambulatory Visit: Payer: PPO

## 2024-04-13 VITALS — Ht 68.0 in | Wt 183.0 lb

## 2024-04-13 DIAGNOSIS — Z0000 Encounter for general adult medical examination without abnormal findings: Secondary | ICD-10-CM | POA: Diagnosis not present

## 2024-04-13 NOTE — Progress Notes (Signed)
 Chief Complaint  Patient presents with   Medicare Wellness     Subjective:  Please attest and cosign this visit due to patients primary care provider not being in the office at the time the visit was completed.  (Pt of Dr Lynwood Rush)   Jose Stevens is a 68 y.o. male who presents for a Medicare Annual Wellness Visit.  I connected with  Jose Stevens on 04/13/24 by a audio enabled telemedicine application and verified that I am speaking with the correct person using two identifiers.  Patient Location: Home  Provider Location: Office/Clinic  Persons Participating in Visit: Patient.  I discussed the limitations of evaluation and management by telemedicine. The patient expressed understanding and agreed to proceed.  Vital Signs: Because this visit was a virtual/telehealth visit, some criteria may be missing or patient reported. Any vitals not documented were not able to be obtained and vitals that have been documented are patient reported.   Allergies (verified) Patient has no known allergies.   History: Past Medical History:  Diagnosis Date   GERD (gastroesophageal reflux disease)    on meds   Hemorrhoids    Hyperlipemia    [place] study LDL goal= <160. NMR Lipoprofile 2007: LDL 188 (2618/2018) HDL 38, TG 127. LDL goal= <100, ideally <75===on meds   Hypertension    on meds   Prostate cancer (HCC)    Dr Aleene   Rhinitis    seasonal   Seasonal allergies    Thyroid  disease    on meds   Past Surgical History:  Procedure Laterality Date   COLONOSCOPY  05/20/2010   JP-F/V-movi(exc)-tics/HPPx1=recall 10 yrs   prostate seed implant  05/20/2006   Dr.Nesi-seed implantation (prostate CA)   TONSILLECTOMY     Family History  Problem Relation Age of Onset   Lung cancer Mother        smoker   Other Father        Suicide   Prostate cancer Brother    Hyperlipidemia Brother        dyslipidemia   Heart disease Brother        5 vessel CABG   Heart attack Brother 58    Heart attack Maternal Uncle 63   Heart attack Maternal Grandfather 56   Colon cancer Neg Hx    Stroke Neg Hx    Diabetes Neg Hx    Colon polyps Neg Hx    Rectal cancer Neg Hx    Stomach cancer Neg Hx    Esophageal cancer Neg Hx    Social History   Occupational History   Occupation: Personal trainer/part-time  Tobacco Use   Smoking status: Former    Current packs/day: 0.00    Types: Cigarettes    Quit date: 05/20/1984    Years since quitting: 39.9   Smokeless tobacco: Never   Tobacco comments:    smoked 1971-1986, up to 1 ppd  Vaping Use   Vaping status: Never Used  Substance and Sexual Activity   Alcohol use: Yes    Alcohol/week: 2.0 standard drinks of alcohol    Types: 1 Glasses of wine, 1 Shots of liquor per week    Comment: socially,   Drug use: No   Sexual activity: Yes   Tobacco Counseling Counseling given: Not Answered Tobacco comments: smoked 1971-1986, up to 1 ppd  SDOH Screenings   Food Insecurity: No Food Insecurity (04/13/2024)  Housing: Unknown (04/13/2024)  Transportation Needs: No Transportation Needs (04/13/2024)  Utilities: Not At Risk (04/13/2024)  Alcohol Screen: Low Risk  (04/11/2023)  Depression (PHQ2-9): Low Risk  (04/13/2024)  Financial Resource Strain: Low Risk  (04/11/2023)  Physical Activity: Sufficiently Active (04/13/2024)  Social Connections: Moderately Integrated (04/13/2024)  Stress: No Stress Concern Present (04/13/2024)  Tobacco Use: Medium Risk (04/13/2024)  Health Literacy: Adequate Health Literacy (04/13/2024)   See flowsheets for full screening details  Depression Screen PHQ 2 & 9 Depression Scale- Over the past 2 weeks, how often have you been bothered by any of the following problems? Little interest or pleasure in doing things: 0 Feeling down, depressed, or hopeless (PHQ Adolescent also includes...irritable): 0 PHQ-2 Total Score: 0 Trouble falling or staying asleep, or sleeping too much: 0 Feeling tired or having  little energy: 0 Poor appetite or overeating (PHQ Adolescent also includes...weight loss): 0 Feeling bad about yourself - or that you are a failure or have let yourself or your family down: 0 Trouble concentrating on things, such as reading the newspaper or watching television (PHQ Adolescent also includes...like school work): 0 Moving or speaking so slowly that other people could have noticed. 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 Intake: Medicare Wellness Visit Type:: Subsequent Annual Wellness Visit Persons participating in visit:: patient Medicare Wellness Visit Mode:: Telephone If telephone:: video declined Because this visit was a virtual/telehealth visit:: vitals recorded from last visit If Telephone or Video please confirm:: I connected with the patient using audio enabled telemedicine application and verified that I am speaking with the correct person using two identifiers; I discussed the limitations of evaluation and management by telemedicine; The patient expressed understanding and agreed to proceed Patient Location:: Home Provider Location:: Office Information given by:: patient Interpreter Needed?: No Pre-visit prep was completed: yes AWV questionnaire completed by patient prior to visit?: no Living arrangements:: (!) lives  alone Patient's Overall Health Status Rating: very good Typical amount of pain: none Does pain affect daily life?: no Are you currently prescribed opioids?: (!) yes  Dietary Habits and Nutritional Risks How many meals a day?: 3 Eats fruit and vegetables daily?: yes Most meals are obtained by: preparing own meals; eating out In the last 2 weeks, have you had any of the following?: none Diabetic:: no  Functional Status Activities of Daily Living (to include ambulation/medication): Independent Ambulation: Independent with device- listed below Home Assistive Devices/Equipment: Dentures (specify type); Eyeglasses Medication Administration: Independent Home Management: Independent Manage your own finances?: yes Primary transportation is: driving Concerns about vision?: no *vision screening is required for WTM* Concerns about hearing?: no  Fall Screening Falls in the past year?: 0 Number of falls in past year: 0 Was there an injury with Fall?: 0 Fall Risk Category Calculator: 0 Patient Fall  Risk Level: Low Fall Risk  Fall Risk Patient at Risk for Falls Due to: No Fall Risks Fall risk Follow up: Falls evaluation completed; Falls prevention discussed  Home and Transportation Safety: All rugs have non-skid backing?: N/A, no rugs All stairs or steps have railings?: N/A, no stairs Grab bars in the bathtub or shower?: (!) no Have non-skid surface in bathtub or shower?: yes Good home lighting?: yes Regular seat belt use?: yes Hospital stays in the last year:: no  Cognitive Assessment Difficulty concentrating, remembering, or making decisions? : no Will 6CIT or Mini Cog be Completed: yes What year is it?: 0 points What month is it?: 0 points Give patient an address phrase to remember (5 components): [address] [place], [place] About what time is it?: 0 points Count backwards from 20 to 1: 0 points Say the months of the year in reverse: 0 points Repeat the address phrase from  earlier: 0 points 6 CIT Score: 0 points  Advance Directives (For Healthcare) Does Patient Have a Medical Advance Directive?: No Would patient like information on creating a medical advance directive?: No - Patient declined  Reviewed/Updated  Reviewed/Updated: Reviewed All (Medical, Surgical, Family, Medications, Allergies, Care Teams, Patient Goals)        Objective:    Today's Vitals   04/13/24 1313  Weight: 183 lb (83 kg)  Height: 5' 8 (1.727 m)   Body mass index is 27.83 kg/m.  Current Medications (verified) Outpatient Encounter Medications as of 04/13/2024  Medication Sig   aspirin 81 MG tablet Take 81 mg by mouth daily.   Azelastine  HCl 137 MCG/SPRAY SOLN USE 2 SPRAYS IN EACH NOSTRIL 2 TIMES DAILY   cyclobenzaprine  (FLEXERIL ) 5 MG tablet Take 1 tablet (5 mg total) by mouth at bedtime as needed for muscle spasms.   diclofenac  Sodium (VOLTAREN ) 1 % GEL Apply 2 g topically 4 (four) times daily.   ibuprofen  (ADVIL ) 800 MG tablet Take 1 tablet (800 mg total) by mouth every 6 (six) hours as needed.   levothyroxine  (SYNTHROID ) 50 MCG tablet TAKE 1 TABLET BY MOUTH ONCE A DAY   loratadine (CLARITIN) 10 MG tablet Take 10 mg by mouth daily as needed.   losartan  (COZAAR ) 50 MG tablet Take 1 tablet (50 mg total) by mouth daily.   methocarbamol  (ROBAXIN ) 500 MG tablet Take 1 tablet (500 mg total) by mouth 2 (two) times daily.   Multiple Vitamin (MULTI VITAMIN DAILY PO) Take 1 tablet by mouth daily as needed.   naproxen  sodium (ALEVE ) 220 MG tablet Take 1 tablet (220 mg total) by mouth 2 (two) times daily with a meal. (Patient taking differently: Take 220 mg by mouth 2 (two) times daily as needed.)   omeprazole  (PRILOSEC) 20 MG capsule TAKE 1 CAPSULE BY MOUTH DAILY   rosuvastatin  (CRESTOR ) 20 MG tablet Take 1 tablet (20 mg total) by mouth 3 (three) times a week.   sildenafil  (VIAGRA ) 100 MG tablet Take 1 tablet (100 mg total) by mouth daily as needed.   tadalafil  (CIALIS ) 5 MG tablet  Take 1 tablet (5 mg total) by mouth daily.   traMADol  (ULTRAM ) 50 MG tablet Take 1 tablet (50 mg total) by mouth every 6 (six) hours as needed.   No facility-administered encounter medications on file as of 04/13/2024.   Hearing/Vision screen Hearing Screening - Comments:: Denies hearing difficulties   Vision Screening - Comments:: Wears rx glasses - up to date with routine eye exams with [facility] Immunizations and Health Maintenance Health  Maintenance  Topic Date Due   Zoster Vaccines- Shingrix (2 of 2) 04/21/2023   COVID-19 Vaccine (4 - 2025-26 season) 01/19/2024   Medicare Annual Wellness (AWV)  04/13/2025   Colonoscopy  04/26/2029   DTaP/Tdap/Td (2 - Td or Tdap) 11/13/2030   Pneumococcal Vaccine: 50+ Years  Completed   Influenza Vaccine  Completed   Hepatitis C Screening  Completed   Meningococcal B Vaccine  Aged Out        Assessment/Plan:  This is a routine wellness examination for Jose Stevens.  Patient Care Team: Norleen Lynwood ORN, MD as PCP - General (Internal Medicine) Raford Riggs, MD as PCP - Cardiology (Cardiology)  I have personally reviewed and noted the following in the patient's chart:   Medical and social history Use of alcohol, tobacco or illicit drugs  Current medications and supplements including opioid prescriptions. Functional ability and status Nutritional status Physical activity Advanced directives List of other physicians Hospitalizations, surgeries, and ER visits in previous 12 months Vitals Screenings to include cognitive, depression, and falls Referrals and appointments  No orders of the defined types were placed in this encounter.  In addition, I have reviewed and discussed with patient certain preventive protocols, quality metrics, and best practice recommendations. A written personalized care plan for preventive services as well as general preventive health recommendations were provided to patient.   Jose Stevens,  CMA   04/13/2024   Return in 1 year (on 04/13/2025).  After Visit Summary: (MyChart) Due to this being a telephonic visit, the after visit summary with patients personalized plan was offered to patient via MyChart   Nurse Notes: Scheduled CPE w/PCP for 04/2024.

## 2024-04-13 NOTE — Patient Instructions (Addendum)
 Jose Stevens,  Thank you for taking the time for your Medicare Wellness Visit. I appreciate your continued commitment to your health goals. Please review the care plan we discussed, and feel free to reach out if I can assist you further.  Please note that Annual Wellness Visits do not include a physical exam. Some assessments may be limited, especially if the visit was conducted virtually. If needed, we may recommend an in-person follow-up with your provider.  Ongoing Care Seeing your primary care provider every 3 to 6 months helps us  monitor your health and provide consistent, personalized care.   Referrals If a referral was made during today's visit and you haven't received any updates within two weeks, please contact the referred provider directly to check on the status.  Recommended Screenings:  Health Maintenance  Topic Date Due   Zoster (Shingles) Vaccine (2 of 2) 04/21/2023   COVID-19 Vaccine (4 - 2025-26 season) 01/19/2024   Medicare Annual Wellness Visit  04/13/2025   Colon Cancer Screening  04/26/2029   DTaP/Tdap/Td vaccine (2 - Td or Tdap) 11/13/2030   Pneumococcal Vaccine for age over 13  Completed   Flu Shot  Completed   Hepatitis C Screening  Completed   Meningitis B Vaccine  Aged Out       04/13/2024    1:15 PM  Advanced Directives  Does Patient Have a Medical Advance Directive? No  Would patient like information on creating a medical advance directive? No - Patient declined    Vision: Annual vision screenings are recommended for early detection of glaucoma, cataracts, and diabetic retinopathy. These exams can also reveal signs of chronic conditions such as diabetes and high blood pressure.  Dental: Annual dental screenings help detect early signs of oral cancer, gum disease, and other conditions linked to overall health, including heart disease and diabetes.

## 2024-05-04 ENCOUNTER — Encounter: Payer: Self-pay | Admitting: Internal Medicine

## 2024-05-04 ENCOUNTER — Other Ambulatory Visit (HOSPITAL_COMMUNITY): Payer: Self-pay

## 2024-05-04 ENCOUNTER — Ambulatory Visit: Admitting: Internal Medicine

## 2024-05-04 ENCOUNTER — Ambulatory Visit: Payer: Self-pay | Admitting: Internal Medicine

## 2024-05-04 ENCOUNTER — Encounter: Payer: Self-pay | Admitting: Nurse Practitioner

## 2024-05-04 ENCOUNTER — Other Ambulatory Visit: Payer: Self-pay

## 2024-05-04 VITALS — BP 122/80 | HR 75 | Temp 98.2°F | Ht 68.0 in | Wt 182.0 lb

## 2024-05-04 DIAGNOSIS — C61 Malignant neoplasm of prostate: Secondary | ICD-10-CM

## 2024-05-04 DIAGNOSIS — E785 Hyperlipidemia, unspecified: Secondary | ICD-10-CM | POA: Diagnosis not present

## 2024-05-04 DIAGNOSIS — I10 Essential (primary) hypertension: Secondary | ICD-10-CM

## 2024-05-04 DIAGNOSIS — E039 Hypothyroidism, unspecified: Secondary | ICD-10-CM

## 2024-05-04 DIAGNOSIS — I2510 Atherosclerotic heart disease of native coronary artery without angina pectoris: Secondary | ICD-10-CM

## 2024-05-04 DIAGNOSIS — E559 Vitamin D deficiency, unspecified: Secondary | ICD-10-CM

## 2024-05-04 DIAGNOSIS — R739 Hyperglycemia, unspecified: Secondary | ICD-10-CM | POA: Diagnosis not present

## 2024-05-04 DIAGNOSIS — R931 Abnormal findings on diagnostic imaging of heart and coronary circulation: Secondary | ICD-10-CM

## 2024-05-04 DIAGNOSIS — Z125 Encounter for screening for malignant neoplasm of prostate: Secondary | ICD-10-CM | POA: Diagnosis not present

## 2024-05-04 DIAGNOSIS — N529 Male erectile dysfunction, unspecified: Secondary | ICD-10-CM

## 2024-05-04 DIAGNOSIS — Z760 Encounter for issue of repeat prescription: Secondary | ICD-10-CM | POA: Diagnosis not present

## 2024-05-04 DIAGNOSIS — Z0001 Encounter for general adult medical examination with abnormal findings: Secondary | ICD-10-CM | POA: Diagnosis not present

## 2024-05-04 DIAGNOSIS — E7849 Other hyperlipidemia: Secondary | ICD-10-CM

## 2024-05-04 DIAGNOSIS — Z0000 Encounter for general adult medical examination without abnormal findings: Secondary | ICD-10-CM

## 2024-05-04 LAB — CBC WITH DIFFERENTIAL/PLATELET
Basophils Absolute: 0 K/uL (ref 0.0–0.1)
Basophils Relative: 0.7 % (ref 0.0–3.0)
Eosinophils Absolute: 0.2 K/uL (ref 0.0–0.7)
Eosinophils Relative: 2.9 % (ref 0.0–5.0)
HCT: 41 % (ref 39.0–52.0)
Hemoglobin: 13.9 g/dL (ref 13.0–17.0)
Lymphocytes Relative: 14.9 % (ref 12.0–46.0)
Lymphs Abs: 1 K/uL (ref 0.7–4.0)
MCHC: 33.9 g/dL (ref 30.0–36.0)
MCV: 86.1 fl (ref 78.0–100.0)
Monocytes Absolute: 0.7 K/uL (ref 0.1–1.0)
Monocytes Relative: 10.7 % (ref 3.0–12.0)
Neutro Abs: 4.6 K/uL (ref 1.4–7.7)
Neutrophils Relative %: 70.8 % (ref 43.0–77.0)
Platelets: 255 K/uL (ref 150.0–400.0)
RBC: 4.76 Mil/uL (ref 4.22–5.81)
RDW: 14.5 % (ref 11.5–15.5)
WBC: 6.5 K/uL (ref 4.0–10.5)

## 2024-05-04 LAB — URINALYSIS, ROUTINE W REFLEX MICROSCOPIC
Bilirubin Urine: NEGATIVE
Hgb urine dipstick: NEGATIVE
Ketones, ur: NEGATIVE
Leukocytes,Ua: NEGATIVE
Nitrite: NEGATIVE
RBC / HPF: NONE SEEN (ref 0–?)
Specific Gravity, Urine: 1.01 (ref 1.000–1.030)
Total Protein, Urine: NEGATIVE
Urine Glucose: NEGATIVE
Urobilinogen, UA: 0.2 (ref 0.0–1.0)
WBC, UA: NONE SEEN (ref 0–?)
pH: 7.5 (ref 5.0–8.0)

## 2024-05-04 LAB — BASIC METABOLIC PANEL WITH GFR
BUN: 9 mg/dL (ref 6–23)
CO2: 31 meq/L (ref 19–32)
Calcium: 9.2 mg/dL (ref 8.4–10.5)
Chloride: 100 meq/L (ref 96–112)
Creatinine, Ser: 0.87 mg/dL (ref 0.40–1.50)
GFR: 87.25 mL/min (ref >60.00)
Glucose, Bld: 90 mg/dL (ref 70–99)
Potassium: 4.5 meq/L (ref 3.5–5.1)
Sodium: 139 meq/L (ref 135–145)

## 2024-05-04 LAB — LIPID PANEL
Cholesterol: 72 mg/dL (ref 28–200)
HDL: 49.8 mg/dL (ref >39.00)
LDL Cholesterol: 11 mg/dL (ref 0–99)
NonHDL: 22.36
Total CHOL/HDL Ratio: 1
Triglycerides: 58 mg/dL (ref 0.0–149.0)
VLDL: 11.6 mg/dL (ref 0.0–40.0)

## 2024-05-04 LAB — HEPATIC FUNCTION PANEL
ALT: 22 U/L (ref 0–53)
AST: 48 U/L — ABNORMAL HIGH (ref 5–37)
Albumin: 4.2 g/dL (ref 3.5–5.2)
Alkaline Phosphatase: 73 U/L (ref 39–117)
Bilirubin, Direct: 0.3 mg/dL (ref 0.0–0.3)
Total Bilirubin: 1 mg/dL (ref 0.2–1.2)
Total Protein: 6.7 g/dL (ref 6.0–8.3)

## 2024-05-04 LAB — HEMOGLOBIN A1C: Hgb A1c MFr Bld: 6 % (ref 4.6–6.5)

## 2024-05-04 LAB — VITAMIN D 25 HYDROXY (VIT D DEFICIENCY, FRACTURES): VITD: 7.42 ng/mL — ABNORMAL LOW (ref 30.00–100.00)

## 2024-05-04 LAB — PSA: PSA: 0 ng/mL — ABNORMAL LOW (ref 0.10–4.00)

## 2024-05-04 LAB — TSH: TSH: 2.05 u[IU]/mL (ref 0.35–5.50)

## 2024-05-04 MED ORDER — LOSARTAN POTASSIUM 50 MG PO TABS
50.0000 mg | ORAL_TABLET | Freq: Every day | ORAL | 3 refills | Status: AC
  Filled 2024-05-04 – 2024-06-16 (×2): qty 90, 90d supply, fill #0

## 2024-05-04 MED ORDER — ROSUVASTATIN CALCIUM 20 MG PO TABS
20.0000 mg | ORAL_TABLET | ORAL | 1 refills | Status: AC
  Filled 2024-05-04 – 2024-06-16 (×2): qty 36, 84d supply, fill #0

## 2024-05-04 MED ORDER — TRAMADOL HCL 50 MG PO TABS
50.0000 mg | ORAL_TABLET | Freq: Four times a day (QID) | ORAL | 0 refills | Status: DC | PRN
  Filled 2024-05-04: qty 30, 8d supply, fill #0

## 2024-05-04 MED ORDER — OMEPRAZOLE 20 MG PO CPDR
20.0000 mg | DELAYED_RELEASE_CAPSULE | Freq: Every day | ORAL | 3 refills | Status: AC
  Filled 2024-05-04: qty 90, fill #0
  Filled 2024-06-16: qty 90, 90d supply, fill #0

## 2024-05-04 MED ORDER — LEVOTHYROXINE SODIUM 50 MCG PO TABS
50.0000 ug | ORAL_TABLET | Freq: Every day | ORAL | 3 refills | Status: AC
  Filled 2024-05-04 – 2024-06-16 (×2): qty 90, 90d supply, fill #0

## 2024-05-04 NOTE — Assessment & Plan Note (Signed)
 Lab Results  Component Value Date   LDLCALC 68 03/25/2023   Stable, pt to continue current statin leqvio  per cardiology, and f/u lab today

## 2024-05-04 NOTE — Assessment & Plan Note (Signed)
 For psa with labs today

## 2024-05-04 NOTE — Assessment & Plan Note (Signed)
 Pt due for cardiology f/u - will refer back

## 2024-05-04 NOTE — Assessment & Plan Note (Signed)
 With recent worsening, no longer taking viagra  cialis  due to lack of efficacy - for urology referral

## 2024-05-04 NOTE — Assessment & Plan Note (Signed)
 Lab Results  Component Value Date   TSH 2.61 03/25/2023   Stable, pt to continue levothyroxine  50 mcg qd

## 2024-05-04 NOTE — Progress Notes (Signed)
 Patient ID: Jose Stevens, male   DOB: Jun 10, 1955, 68 y.o.   MRN: [id number]         Chief Complaint:: wellness exam and CAD, prostate CA, ED, low vit d, low thyroid , hld, hyperglycemia, htn       HPI:  Jose Stevens is a 68 y.o. male here for wellness exam; remains active working as a psychologist, educational at [employer]; for shingrix #2 at the pharmacy, o/w up to date                        Also Pt denies chest pain, increased sob or doe, wheezing, orthopnea, PND, increased LE swelling, palpitations, dizziness or syncope.   Pt denies polydipsia, polyuria, or new focal neuro s/s.   Pt denies fever, wt loss, night sweats, loss of appetite, or other constitutional symptoms  Due for cardiology f/u but does not have appt.  Has persistent ED despite viagra  cialis  trials, asks for urology referral to consider implant.  No longer seeing Dr Aleene as he is retired.     Wt Readings from Last 3 Encounters:  05/04/24 182 lb (82.6 kg)  04/13/24 183 lb (83 kg)  04/09/24 183 lb 9.6 oz (83.3 kg)   BP Readings from Last 3 Encounters:  05/04/24 122/80  04/09/24 129/78  11/13/23 102/78   Immunization History  Administered Date(s) Administered   Fluad Quad(high Dose 65+) 01/13/2019, 03/09/2020, 03/12/2021, 03/22/2022   Fluad Trivalent(High Dose 65+) 01/15/2023   INFLUENZA, HIGH DOSE SEASONAL PF 02/26/2024   Influenza Split 04/30/2011, 02/13/2012   Influenza Whole 03/30/2009   Influenza,inj,Quad PF,6+ Mos 05/19/2013, 06/06/2015, 02/09/2016, 06/17/2017, 05/28/2018   PFIZER(Purple Top)SARS-COV-2 Vaccination 06/11/2019, 07/02/2019, 03/27/2020   Pneumococcal Conjugate-13 02/18/2019   Pneumococcal Polysaccharide-23 03/12/2021   Tdap 11/12/2020   Zoster Recombinant(Shingrix) 02/24/2023   Health Maintenance Due  Topic Date Due   Zoster Vaccines- Shingrix (2 of 2) 04/21/2023      Past Medical History:  Diagnosis Date   GERD (gastroesophageal reflux disease)    on meds   Hemorrhoids    Hyperlipemia    [place]  study LDL goal= <160. NMR Lipoprofile 2007: LDL 188 (2618/2018) HDL 38, TG 127. LDL goal= <100, ideally <75===on meds   Hypertension    on meds   Prostate cancer (HCC)    Dr Aleene   Rhinitis    seasonal   Seasonal allergies    Thyroid  disease    on meds   Past Surgical History:  Procedure Laterality Date   COLONOSCOPY  05/20/2010   JP-F/V-movi(exc)-tics/HPPx1=recall 10 yrs   prostate seed implant  05/20/2006   Dr.Nesi-seed implantation (prostate CA)   TONSILLECTOMY      reports that he quit smoking about 39 years ago. His smoking use included cigarettes. He has never used smokeless tobacco. He reports current alcohol use of about 2.0 standard drinks of alcohol per week. He reports that he does not use drugs. family history includes Heart attack (age of onset: 41) in his maternal grandfather; Heart attack (age of onset: 42) in his brother; Heart attack (age of onset: 36) in his maternal uncle; Heart disease in his brother; Hyperlipidemia in his brother; Lung cancer in his mother; Other in his father; Prostate cancer in his brother. Allergies[1] Medications Ordered Prior to Encounter[2]      ROS:  All others reviewed and negative.  Objective        PE:  BP 122/80 (BP Location: Right Arm, Patient Position: Sitting, Cuff Size:  Normal)   Pulse 75   Temp 98.2 F (36.8 C) (Oral)   Ht 5' 8 (1.727 m)   Wt 182 lb (82.6 kg)   SpO2 95%   BMI 27.67 kg/m                 Constitutional: Pt appears in NAD               HENT: Head: NCAT.                Right Ear: External ear normal.                 Left Ear: External ear normal.                Eyes: . Pupils are equal, round, and reactive to light. Conjunctivae and EOM are normal               Nose: without d/c or deformity               Neck: Neck supple. Gross normal ROM               Cardiovascular: Normal rate and regular rhythm.                 Pulmonary/Chest: Effort normal and breath sounds without rales or wheezing.                 Abd:  Soft, NT, ND, + BS, no organomegaly               Neurological: Pt is alert. At baseline orientation, motor grossly intact               Skin: Skin is warm. No rashes, no other new lesions, LE edema - none               Psychiatric: Pt behavior is normal without agitation   Micro: none  Cardiac tracings I have personally interpreted today:  none  Pertinent Radiological findings (summarize): none   Lab Results  Component Value Date   WBC 7.1 03/25/2023   HGB 13.2 03/25/2023   HCT 41.2 03/25/2023   PLT 258.0 03/25/2023   GLUCOSE 88 03/25/2023   CHOL 139 03/25/2023   TRIG 71.0 03/25/2023   HDL 57.30 03/25/2023   LDLDIRECT 160.8 05/06/2007   LDLCALC 68 03/25/2023   ALT 12 03/25/2023   AST 16 03/25/2023   NA 138 03/25/2023   K 4.8 03/25/2023   CL 102 03/25/2023   CREATININE 0.92 03/25/2023   BUN 11 03/25/2023   CO2 31 03/25/2023   TSH 2.61 03/25/2023   PSA 0.01 (L) 03/25/2023   INR 0.9 12/15/2006   HGBA1C 5.9 03/25/2023   Assessment/Plan:  Jose Stevens is a 68 y.o. Black or African American [2] male with  has a past medical history of GERD (gastroesophageal reflux disease), Hemorrhoids, Hyperlipemia, Hypertension, Prostate cancer (HCC), Rhinitis, Seasonal allergies, and Thyroid  disease.  PROSTATE CANCER For psa with labs today  Encounter for well adult exam with abnormal findings Age and sex appropriate education and counseling updated with regular exercise and diet Referrals for preventative services - none needed Immunizations addressed - for shingrix #2 at the pharmacy Smoking counseling  - none needed Evidence for depression or other mood disorder - none significant Most recent labs reviewed. I have personally reviewed and have noted: 1) the patient's medical and social history 2) The patient's current medications and supplements 3) The patient's height, weight,  and BMI have been recorded in the chart   Vitamin D  deficiency Last vitamin D  Lab  Results  Component Value Date   VD25OH 25.92 (L) 03/25/2023   Low, to start oral replacement   Hypothyroidism Lab Results  Component Value Date   TSH 2.61 03/25/2023   Stable, pt to continue levothyroxine  50 mcg qd   Hyperlipidemia Lab Results  Component Value Date   LDLCALC 68 03/25/2023   Stable, pt to continue current statin leqvio  per cardiology, and f/u lab today   Hyperglycemia Lab Results  Component Value Date   HGBA1C 5.9 03/25/2023   Stable, pt to continue current medical treatment  - diet, wt control   Essential hypertension BP Readings from Last 3 Encounters:  05/04/24 122/80  04/09/24 129/78  11/13/23 102/78   Stable, pt to continue medical treatment losartan  50 mg qd   Erectile dysfunction With recent worsening, no longer taking viagra  cialis  due to lack of efficacy - for urology referral  Elevated coronary artery calcium  score Pt due for cardiology f/u - will refer back  Followup: Return in about 6 months (around 11/02/2024).  Lynwood Rush, MD 05/04/2024 10:00 AM [facility]     [1] No Known Allergies [2]  Current Outpatient Medications on File Prior to Visit  Medication Sig Dispense Refill   aspirin 81 MG tablet Take 81 mg by mouth daily.     cyclobenzaprine  (FLEXERIL ) 5 MG tablet Take 1 tablet (5 mg total) by mouth at bedtime as needed for muscle spasms. 7 tablet 0   diclofenac  Sodium (VOLTAREN ) 1 % GEL Apply 2 g topically 4 (four) times daily. 120 g 2   ibuprofen  (ADVIL ) 800 MG tablet Take 1 tablet (800 mg total) by mouth every 6 (six) hours as needed. 20 tablet 2   loratadine (CLARITIN) 10 MG tablet Take 10 mg by mouth daily as needed.     methocarbamol  (ROBAXIN ) 500 MG tablet Take 1 tablet (500 mg total) by mouth 2 (two) times daily. 20 tablet 0   Multiple Vitamin (MULTI VITAMIN DAILY PO) Take 1 tablet by mouth daily as needed.     naproxen  sodium (ALEVE ) 220 MG tablet  Take 1 tablet (220 mg total) by mouth 2 (two) times daily with a meal. (Patient taking differently: Take 220 mg by mouth 2 (two) times daily as needed.) 30 tablet    sildenafil  (VIAGRA ) 100 MG tablet Take 1 tablet (100 mg total) by mouth daily as needed. 10 tablet 11   tadalafil  (CIALIS ) 5 MG tablet Take 1 tablet (5 mg total) by mouth daily. 30 tablet 11   Azelastine  HCl 137 MCG/SPRAY SOLN USE 2 SPRAYS IN EACH NOSTRIL 2 TIMES DAILY 30 mL 5   No current facility-administered medications on file prior to visit.

## 2024-05-04 NOTE — Patient Instructions (Addendum)
 Please have your Shingrix (shingles) shots done at your local pharmacy.  Please continue all other medications as before, and refills have been done if requested.  Please have the pharmacy call with any other refills you may need.  Please continue your efforts at being more active, low cholesterol diet, and weight control.  You are otherwise up to date with prevention measures today.  Please keep your appointments with your specialists as you may have planned  Please go to the LAB at the blood drawing area for the tests to be done  You will be contacted by phone if any changes need to be made immediately.  Otherwise, you will receive a letter about your results with an explanation, but please check with MyChart first.  Please make an Appointment to return in 6 months, or sooner if needed

## 2024-05-04 NOTE — Assessment & Plan Note (Signed)
 Lab Results  Component Value Date   HGBA1C 5.9 03/25/2023   Stable, pt to continue current medical treatment  - diet, wt control

## 2024-05-04 NOTE — Assessment & Plan Note (Signed)
 BP Readings from Last 3 Encounters:  05/04/24 122/80  04/09/24 129/78  11/13/23 102/78   Stable, pt to continue medical treatment losartan  50 mg qd

## 2024-05-04 NOTE — Assessment & Plan Note (Signed)
 Last vitamin D  Lab Results  Component Value Date   VD25OH 25.92 (L) 03/25/2023   Low, to start oral replacement

## 2024-05-04 NOTE — Assessment & Plan Note (Signed)
Age and sex appropriate education and counseling updated with regular exercise and diet Referrals for preventative services - none needed Immunizations addressed - for shingrix #2 at the pharmacy Smoking counseling  - none needed Evidence for depression or other mood disorder - none significant Most recent labs reviewed. I have personally reviewed and have noted: 1) the patient's medical and social history 2) The patient's current medications and supplements 3) The patient's height, weight, and BMI have been recorded in the chart

## 2024-05-09 ENCOUNTER — Encounter (HOSPITAL_COMMUNITY): Payer: Self-pay

## 2024-05-10 ENCOUNTER — Other Ambulatory Visit (HOSPITAL_COMMUNITY): Payer: Self-pay

## 2024-05-14 ENCOUNTER — Other Ambulatory Visit (HOSPITAL_COMMUNITY): Payer: Self-pay

## 2024-05-16 NOTE — Progress Notes (Unsigned)
 " Cardiology Office Note:  .   Date:  05/17/2024 ID:  Jose Stevens, DOB 06/22/1955, MRN [id number] PCP: Norleen Lynwood ORN, MD [facility] Providers Cardiologist:  None   Patient Profile: .      PMH Coronary artery disease CT calcium  score 775 (79th percentile) completed 01/27/23 Negative ETT 2008 Hyperlipidemia Hypertension Family history CAD Fathers and brothers with prior MIs Brother with CABG Prostate cancer diagnosed in 2008, cancer free  Mildly elevated lipoprotein a  Referred for new patient consult for elevated coronary calcium  score.  CT calcium  score completed 01/27/2023 revealed CAC score 775 (79th percentile). He previously underwent exercise stress test in 2008 which was negative. On statin since approximately 2013. 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  Has been working to lose weight and weighs the least he has ever weighed. Wanted to decrease his overall body fat. Does a cleanse ever Monday of water with lemon juice and cayenne pepper. Family history of father and brothers with previous MI. Is more active and weighs considerably less than these family members. His youngest brother is a physician and also has an elevated calcium  score, has not had an MI. Admits he has not been entirely consistent with rosuvastatin  daily. Is currently taking every other day due to soreness, has missed doses as much as a week's worth.  No concerning cardiac symptoms including chest pain, palpitations, shortness of breath, dyspnea, orthopnea, PND, edema, presyncope or syncope. ASCVD risk score was elevated at 15.2% due to history of hyper lipidemia and hypertension. Often times not eating much in the daytime, maybe oatmeal or protein shake. Dinner is usually anything he wants - mostly fish and chicken, occasional hamburgers and fries.  He does not drink EtOH. Through shared decision making, we elected to pursue ischemia evaluation.   He underwent exercise Myoview  02/19/2023 which was low risk, excellent exercise capacity, no evidence of ischemia or infarction, normal EF 58%.  Seen in follow-up by me on 04/16/23, feeling well and continuing to exercise consistently. We discussed his stress test results in detail.  Remains asymptomatic with no chest pain, shortness of breath, orthopnea, palpitations, PND, edema, presyncope, or syncope. We discussed slightly elevated lipoprotein a. He has been reading and talking with his brother who is a physician and he is interested in pursuing PCSK9 inhibitor therapy.  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 He was started on [name]  for management of hyperlipidemia.   Lipid panel completed 05/04/24 revealed total cholesterol 72, triglycerides 58, HDL 49.8, LDL 11. Testing also revealed low vitamin d  level and supplementation was encouraged.        History of Present Illness: .    Discussed the use of AI scribe software for clinical note transcription with the patient, who gave verbal consent to proceed.  History of Present Illness Jose Stevens is a very pleasant 68 year old male who is here today for follow-up of coronary artery disease.  He is satisfied with his recent cholesterol improvement, with a significant LDL reduction on [name] , which he tolerates without adverse effects. Has noted blood in stool, likely secondary to hemorrhoids but prompting him to stop aspirin. He feels frequently tired and has had persistently very low vitamin D  levels, most recently 7 ng/mL. He has vitamin D  at home but is inconsistent with taking it and is unsure of the dose. He stays physically active and trains  regularly. He notes occasional lightheadedness when standing, which he views as normal. His weight is stable at 173 to 178 pounds, and he tries to keep it under 180 pounds. Strong family history of significant coronary disease, with one brother having  a myocardial infarction two years ago and another brother undergoing quintuple bypass surgery. He reports that his diet is imperfect but he attempts to maintain a healthy weight, occasionally practices fasting, and tries to choose healthy foods.  He denies chest pain, dyspnea, palpitations, orthopnea, PND, edema, presyncope, syncope.  He does not monitor BP consistently.   ROS: See HPI       Studies Reviewed: SABRA   EKG Interpretation Date/Time:  Monday May 17 2024 09:17:16 EST Ventricular Rate:  76 PR Interval:  146 QRS Duration:  72 QT Interval:  356 QTC Calculation: 400 R Axis:   55  Text Interpretation: Normal sinus rhythm Abnormal QRS-T angle, consider primary T wave abnormality When compared with ECG of 12-Feb-2023 14:06, No significant change was found Confirmed by Percy Browning [phone]) on 05/17/2024 9:31:57 AM      Risk Assessment/Calculations:             Physical Exam:   VS: BP 122/74 (BP Location: Left Arm, Patient Position: Sitting, Cuff Size: Normal)   Pulse 76   Ht 5' 8 (1.727 m)   Wt 180 lb (81.6 kg)   SpO2 98%   BMI 27.37 kg/m   Wt Readings from Last 3 Encounters:  05/17/24 180 lb (81.6 kg)  05/04/24 182 lb (82.6 kg)  04/13/24 183 lb (83 kg)     GEN: Well nourished, well developed in no acute distress NECK: No JVD; No carotid bruits CARDIAC: RRR, no murmurs, rubs, gallops RESPIRATORY:  Clear to auscultation without rales, wheezing or rhonchi  ABDOMEN: Soft, non-tender, non-distended EXTREMITIES:  No edema; No deformity     ASSESSMENT AND PLAN: .    Coronary artery disease Cardiac risk CT calcium  score 02/06/23 with CAC 775 (79th percentile). Exercise myoview  was low risk with excellent exercise capacity achieving peak METS 10.1, normal heart rate recovery, and no concerns for ischemia or infarction.  EF was normal. Lengthy discussion about ASCVD risk. He remains active and denies chest pain or DOE but is concerned about increasing fatigue.  Given  significant family history and additional risk factors, we will proceed with further ischemia evaluation.  We discussed bleeding from hemorrhoids.  Recommend that he take aspirin at least 3 to 4 days/week. BP is well controlled. Lipids are very well controlled.  -Coronary CTA for evaluation of ischemia and mapping of coronary anatomy -Lopressor  50 mg 2 hours prior to CT -Resume aspirin 81 mg 3 to 4 days/week  - Continue rosuvastatin , losartan  - Continue regular exercise  - Heart healthy diet avoiding processed foods, saturated fat, sugar, and other simple carbohydrates encouraged  Hyperlipidemia LDL goal < 55 Aortic atherosclerosis Mildly elevated lipoprotein a of 83.9.  Lipid panel completed 05/04/2024 with total cholesterol 72, triglycerides 58, HDL 49, LDL 11.  Significant improvement from previous LDL of 68 1 year prior.  He is tolerating inclisiran and rosuvastatin  without concerning side effects. - Continue inclisiran per schedule - Continue rosuvastatin  20 mg daily - Heart healthy diet avoiding processed foods, saturated fat, sugar, and other simple carbohydrates encouraged - Be as physically active as possible every day and aim for at least 150 minutes of moderate intensity exercise each week  Vitamin D  deficiency Vitamin D  level 7.42 ng/mL on labs completed well/16/25.  He reported that it had been low but he was not aware of significant low.  He is not on supplementation currently. - Start 50,000 IU vitamin D  once weekly for 8 weeks - After 8 weeks, start vitamin D  2000 IU daily  Hypertension BP is well controlled. Encouraged routine home monitoring for goal BP < 130/80.  Renal function is stable on lab work completed 05/04/24. No change in anti-hypertensive therapy today. - Continue losartan          Dispo: 1 year with me  Signed, Rosaline Bane, NP-C "

## 2024-05-17 ENCOUNTER — Ambulatory Visit (INDEPENDENT_AMBULATORY_CARE_PROVIDER_SITE_OTHER): Admitting: Nurse Practitioner

## 2024-05-17 ENCOUNTER — Other Ambulatory Visit (HOSPITAL_COMMUNITY): Payer: Self-pay

## 2024-05-17 ENCOUNTER — Encounter (HOSPITAL_BASED_OUTPATIENT_CLINIC_OR_DEPARTMENT_OTHER): Payer: Self-pay | Admitting: Nurse Practitioner

## 2024-05-17 ENCOUNTER — Encounter (HOSPITAL_BASED_OUTPATIENT_CLINIC_OR_DEPARTMENT_OTHER): Payer: Self-pay

## 2024-05-17 VITALS — BP 122/74 | HR 76 | Ht 68.0 in | Wt 180.0 lb

## 2024-05-17 DIAGNOSIS — E785 Hyperlipidemia, unspecified: Secondary | ICD-10-CM

## 2024-05-17 DIAGNOSIS — Z7189 Other specified counseling: Secondary | ICD-10-CM | POA: Diagnosis not present

## 2024-05-17 DIAGNOSIS — I700 Atherosclerosis of aorta: Secondary | ICD-10-CM | POA: Diagnosis not present

## 2024-05-17 DIAGNOSIS — E559 Vitamin D deficiency, unspecified: Secondary | ICD-10-CM

## 2024-05-17 DIAGNOSIS — I10 Essential (primary) hypertension: Secondary | ICD-10-CM

## 2024-05-17 DIAGNOSIS — I2510 Atherosclerotic heart disease of native coronary artery without angina pectoris: Secondary | ICD-10-CM | POA: Diagnosis not present

## 2024-05-17 MED ORDER — METOPROLOL TARTRATE 50 MG PO TABS
50.0000 mg | ORAL_TABLET | Freq: Once | ORAL | 0 refills | Status: AC
  Filled 2024-05-17: qty 1, 1d supply, fill #0

## 2024-05-17 MED ORDER — ASPIRIN 81 MG PO TBEC: DELAYED_RELEASE_TABLET | ORAL | Status: AC

## 2024-05-17 MED ORDER — VITAMIN D (ERGOCALCIFEROL) 1.25 MG (50000 UNIT) PO CAPS
50000.0000 [IU] | ORAL_CAPSULE | ORAL | 0 refills | Status: AC
  Filled 2024-05-17: qty 10, 100d supply, fill #0

## 2024-05-17 MED ORDER — VITAMIN D (ERGOCALCIFEROL) 1.25 MG (50000 UNIT) PO CAPS
50000.0000 [IU] | ORAL_CAPSULE | Freq: Every day | ORAL | 0 refills | Status: DC
  Filled 2024-05-17: qty 60, 60d supply, fill #0

## 2024-05-17 NOTE — Patient Instructions (Addendum)
 Medication Instructions:   START Vitamin D  Take 1 capsule (50,000 Units total) by mouth daily after 8 weeks start Vitamin D  1 capsule ( 2000 international units ) daily OTC.  START Aspirin EC one (1) tablet by mouth (81 mg) 3-4 times weekly.   *If you need a refill on your cardiac medications before your next appointment, please call your pharmacy*  Lab Work:  None ordered.  If you have labs (blood work) drawn today and your tests are completely normal, you will receive your results only by: MyChart Message (if you have MyChart) OR A paper copy in the mail If you have any lab test that is abnormal or we need to change your treatment, we will call you to review the results.  Testing/Procedures:    Your cardiac CT will be scheduled at one of the below locations:   [facility] [address]  [place], [place] [address]   If scheduled at the [facility] at [address], please enter the parking lot using the [address] entrance and use the FREE valet service at the patient drop-off area. Enter the building and check-in with registration on the main floor.  Please follow these instructions carefully (unless otherwise directed):  An IV will be required for this test and Nitroglycerin will be given.  Hold all erectile dysfunction medications at least 3 days (72 hrs) prior to test. (Ie viagra , cialis , sildenafil , tadalafil , etc)   On the Night Before the Test: Be sure to Drink plenty of water. Do not consume any caffeinated/decaffeinated beverages or chocolate 12 hours prior to your test. Do not take any antihistamines 12 hours prior to your test.  On the Day of the Test: Drink plenty of water until 1 hour prior to the test. Do not eat any food 1 hour prior to test. You may take your regular medications prior to the test.  Take metoprolol  (Lopressor ) one (1) tablet by mouth ( 50 mg)  two hours prior to test.      After the  Test: Drink plenty of water. After receiving IV contrast, you may experience a mild flushed feeling. This is normal. On occasion, you may experience a mild rash up to 24 hours after the test. This is not dangerous. If this occurs, you can take Benadryl 25 mg, Zyrtec, Claritin, or Allegra and increase your fluid intake. (Patients taking Tikosyn should avoid Benadryl, and may take Zyrtec, Claritin, or Allegra) If you experience trouble breathing, this can be serious. If it is severe call 911 IMMEDIATELY. If it is mild, please call our office.  We will call to schedule your test 2-4 weeks out understanding that some insurance companies will need an authorization prior to the service being performed.   For more information and frequently asked questions, please visit our website : [email or web address]  For non-scheduling related questions, please contact the cardiac imaging nurse navigator should you have any questions/concerns: Cardiac Imaging Nurse Navigators Direct Office Dial: [phone]   For scheduling needs, including cancellations and rescheduling, please call Brittany, [phone].   Follow-Up: At [facility], you and your health needs are our priority.  As part of our continuing mission to provide you with exceptional heart care, our providers are all part of one team.  This team includes your primary Cardiologist (physician) and Advanced Practice Providers or APPs (Physician Assistants and Nurse Practitioners) who all work together to provide you with the care you need, when you need it.  Your next appointment:   1 year(s)  Provider:   Rosaline Bane, NP    We recommend signing up for the patient portal called MyChart.  Sign up information is provided on this After Visit Summary.  MyChart is used to connect with patients for Virtual Visits (Telemedicine).  Patients are able to view lab/test results, encounter notes, upcoming appointments, etc.  Non-urgent  messages can be sent to your provider as well.   To learn more about what you can do with MyChart, go to [email or web address].   Other Instructions Your physician wants you to follow-up in: 1 year.  You will receive a reminder letter in the mail two months in advance. If you don't receive a letter, please call our office to schedule the follow-up appointment.

## 2024-05-20 ENCOUNTER — Telehealth: Payer: Self-pay

## 2024-05-20 NOTE — Telephone Encounter (Signed)
 [name]  has been approved for re-enrollment thru 05/19/2025. No interruptions of services, original program expires 05/19/2024.  Approval extended to 05/20/2024 - 05/19/2025.  Reason for Assist: OOP.  ID: [id number].   [employer] advantage is active and no prior auth is needed. Patient will continue getting [name]  from PAP for 2026.

## 2024-06-16 ENCOUNTER — Other Ambulatory Visit: Payer: Self-pay | Admitting: Internal Medicine

## 2024-06-16 ENCOUNTER — Other Ambulatory Visit (HOSPITAL_COMMUNITY): Payer: Self-pay

## 2024-06-17 ENCOUNTER — Other Ambulatory Visit (HOSPITAL_COMMUNITY): Payer: Self-pay

## 2024-06-17 ENCOUNTER — Encounter: Payer: Self-pay | Admitting: Nurse Practitioner

## 2024-06-17 MED ORDER — TADALAFIL 5 MG PO TABS
5.0000 mg | ORAL_TABLET | Freq: Every day | ORAL | 11 refills | Status: AC
  Filled 2024-06-17: qty 30, 30d supply, fill #0

## 2024-06-23 ENCOUNTER — Other Ambulatory Visit (HOSPITAL_COMMUNITY): Payer: Self-pay

## 2024-10-08 ENCOUNTER — Ambulatory Visit

## 2025-04-18 ENCOUNTER — Ambulatory Visit
